# Patient Record
Sex: Male | Born: 1985 | Race: Black or African American | Hispanic: No | Marital: Single | Smoking: Never smoker
Health system: Southern US, Community
[De-identification: ages and names within clinical notes are randomized; demographics above are authoritative.]

## PROBLEM LIST (undated history)

## (undated) DIAGNOSIS — F419 Anxiety disorder, unspecified: Secondary | ICD-10-CM

## (undated) DIAGNOSIS — M2550 Pain in unspecified joint: Secondary | ICD-10-CM

## (undated) DIAGNOSIS — F32A Depression, unspecified: Secondary | ICD-10-CM

## (undated) HISTORY — DX: Anxiety disorder, unspecified: F41.9

## (undated) HISTORY — DX: Depression, unspecified: F32.A

## (undated) HISTORY — DX: Pain in unspecified joint: M25.50

## (undated) HISTORY — PX: EYE SURGERY: SHX253

---
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
## 2007-07-08 ENCOUNTER — Emergency Department (HOSPITAL_COMMUNITY): Admission: EM | Admit: 2007-07-08 | Discharge: 2007-07-08 | Payer: Self-pay | Admitting: Emergency Medicine

## 2007-07-09 ENCOUNTER — Inpatient Hospital Stay: Payer: Self-pay | Admitting: Internal Medicine

## 2008-07-30 IMAGING — CT CT ABD-PELV W/O CM
1 of 2 series · 14 of 32 positions shown, 18 images · non-contrast
Comparison: none

REASON FOR EXAM: Sudden onset of severe diffuse abdominal pain with nausea
COMMENTS:   LMP: (Male)

[Series 2: abd/pelvis · axial · 0.83mm/px · z∈[-1056,-550]mm · 14 of 185 slices shown, 18 images]
[im 8/185  soft-tissue]
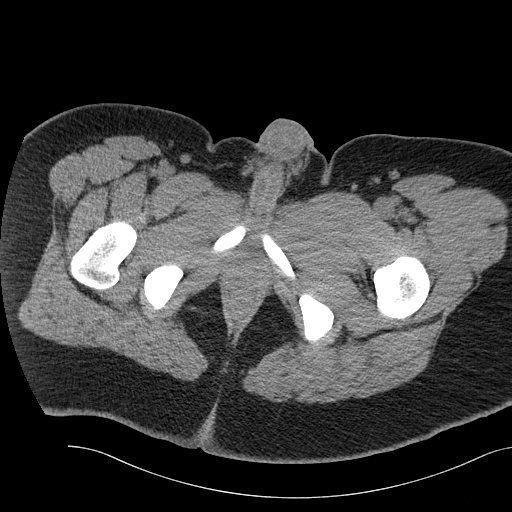
[im 8/185  bone]
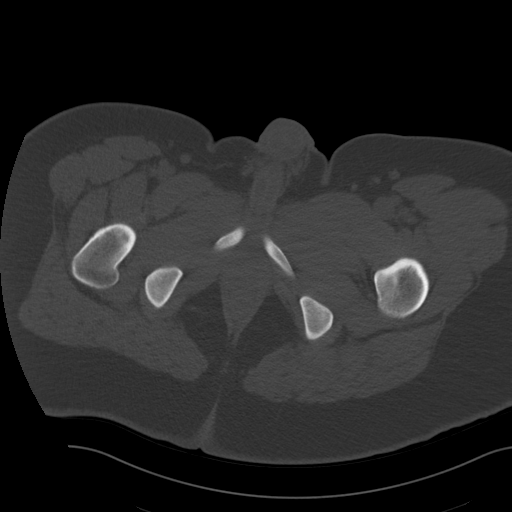
[im 24/185  soft-tissue]
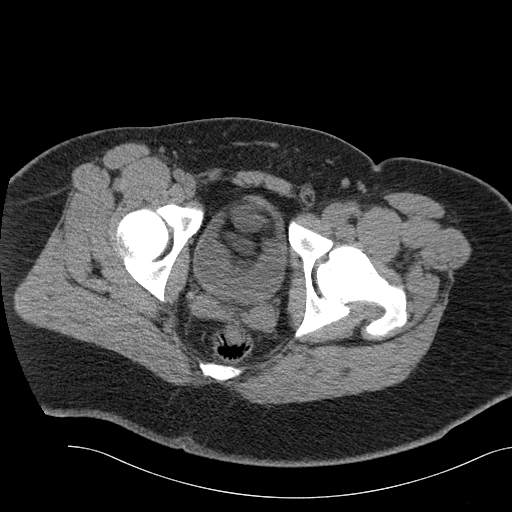
[im 39/185  soft-tissue]
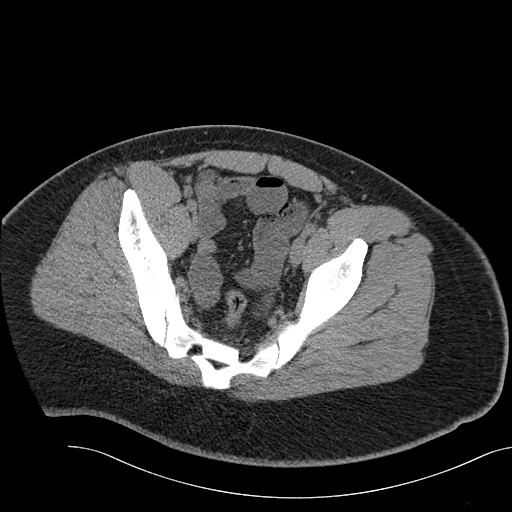
[im 54/185  soft-tissue]
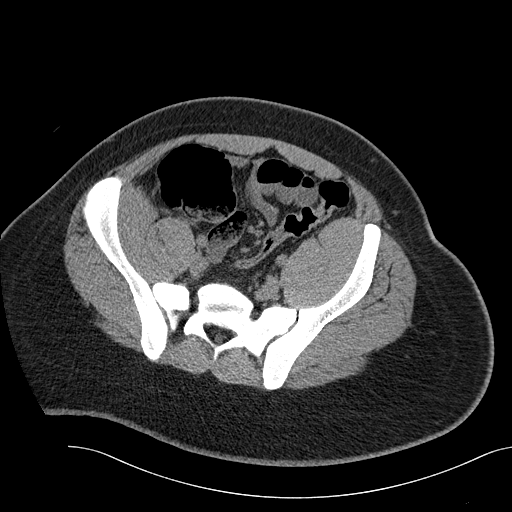
[im 70/185  soft-tissue]
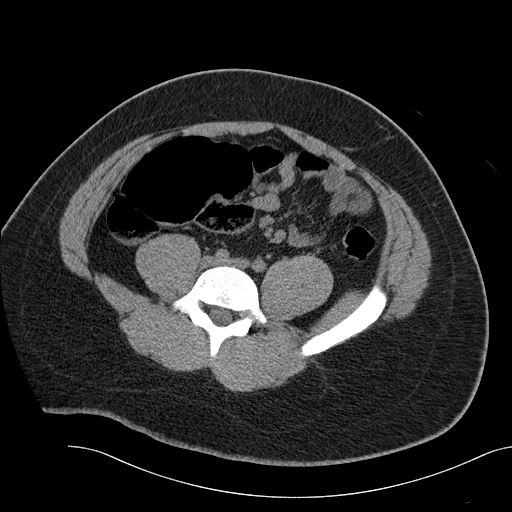
[im 85/185  soft-tissue]
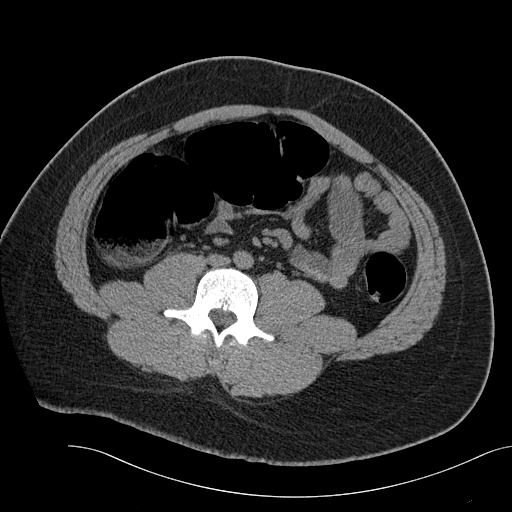
[im 100/185  soft-tissue]
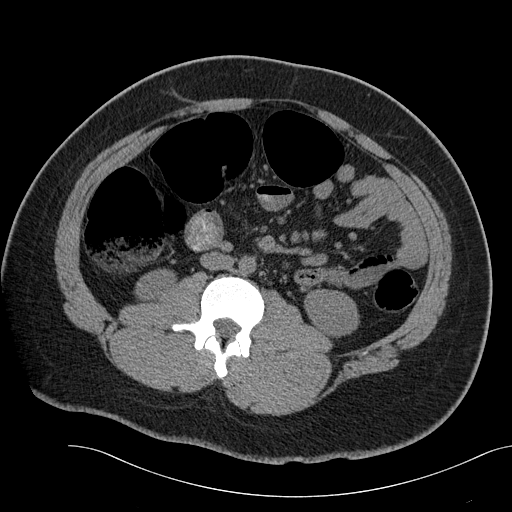
[im 116/185  soft-tissue]
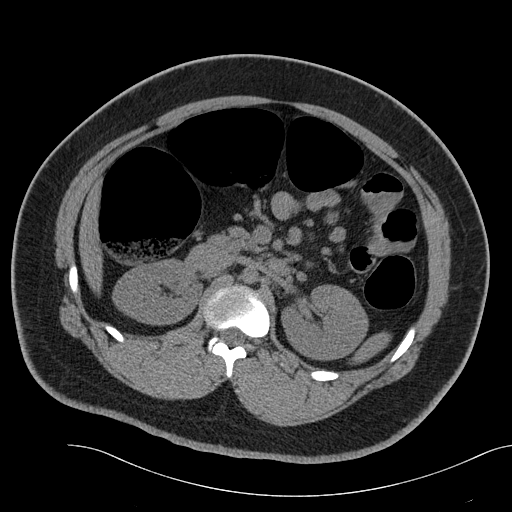
[im 131/185  soft-tissue]
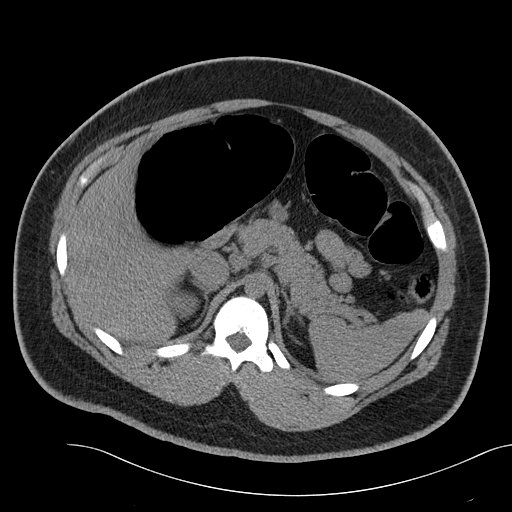
[im 131/185  bone]
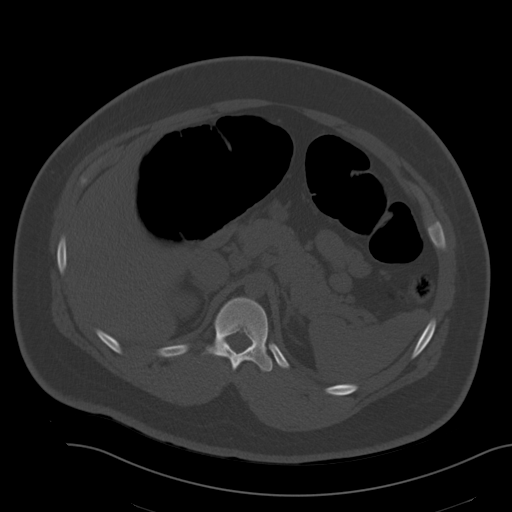
[im 146/185  soft-tissue]
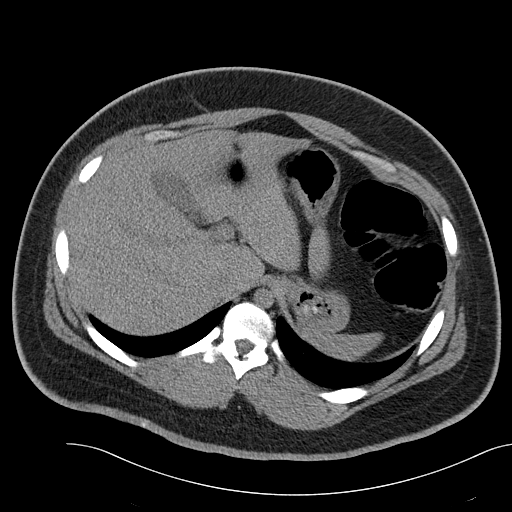
[im 154/185  lung]
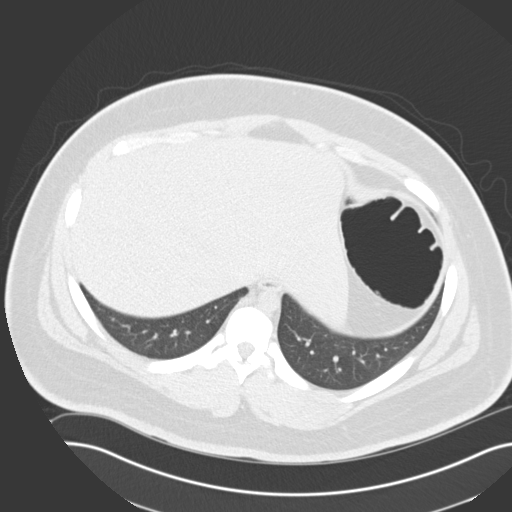
[im 162/185  soft-tissue]
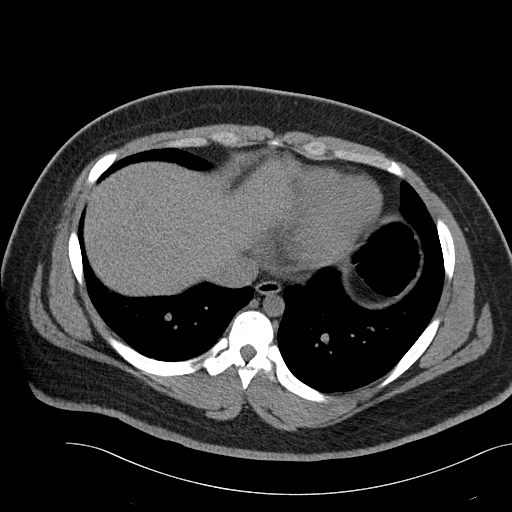
[im 162/185  lung]
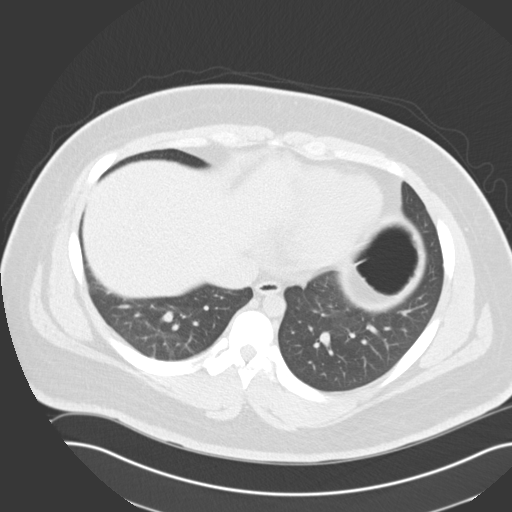
[im 169/185  lung]
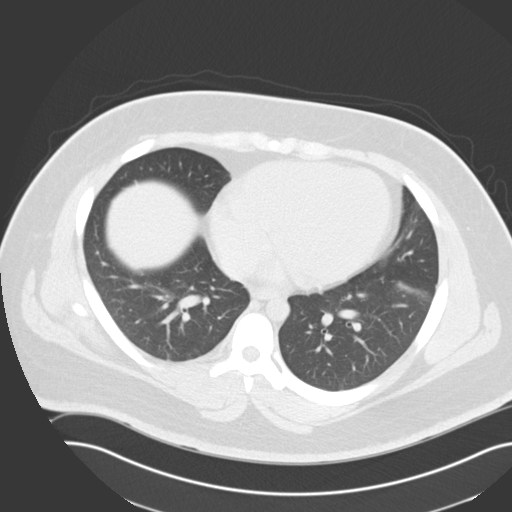
[im 177/185  soft-tissue]
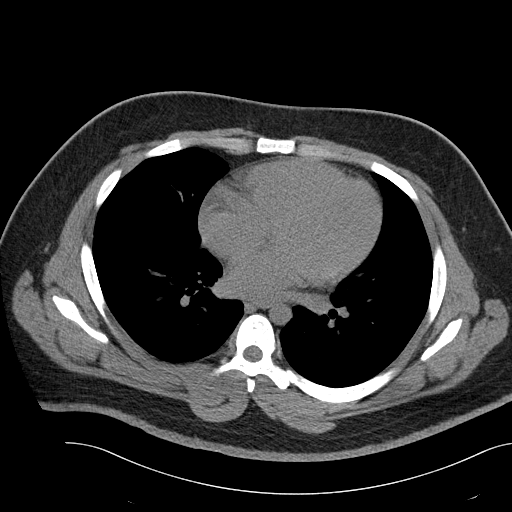
[im 177/185  lung]
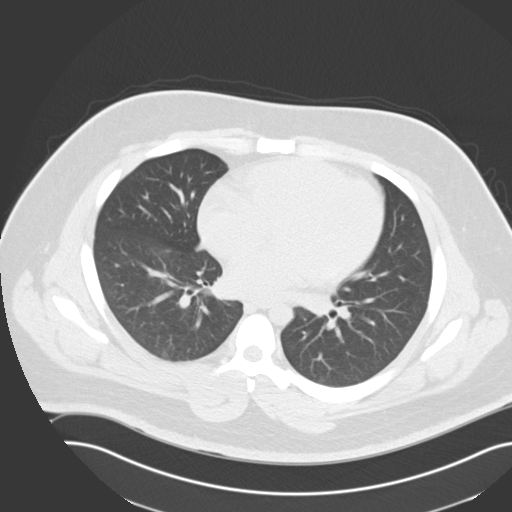

[14 of 32 positions shown; findings below may reference images not displayed]

PROCEDURE:     CT  - CT ABDOMEN AND PELVIS W[DATE]  [DATE]
RESULT:     Helical, noncontrast, 3.0 mm sections were obtained from the
lung bases through the pubic symphysis.

Evaluation of the lung bases demonstrates no gross abnormalities. Areas of
mild hypoventilation are appreciated.

Within the limitations of a noncontrast CT, the liver, spleen, adrenals,
pancreas and kidneys are unremarkable. Evaluation of the bowel demonstrates
multiple air/filled, mildly dilated/distended loops of large bowel. The
large bowel decompresses in the region of the descending colon with near
complete decompression of the sigmoid colon. There does not appear to be CT
evidence of obstructing masses. The patient does not have a history of
surgery. The cecum measures approximately 7.3 cm in diameter and the hepatic
flexure measures 9.3 cm. There does not appear to be evidence of significant
bowel wall thickening. Loops of small bowel are identified which appear to
be fluid filled. There is no evidence of abdominal or pelvic free fluid,
drainable loculated fluid collections or masses. Multiple lymph nodes are
appreciated scattered throughout the mesentery which appears to measure
cm and lower in size. There does not appear to be significant
retroperitoneal adenopathy. No free fluid or drainable loculated fluid
collections are identified. Evaluation of the pelvis demonstrates fluid
filled distended loops of small bowel without evidence of free fluid or
drainable loculated fluid collections. There does not appear to be evidence
of masses.
IMPRESSION: 1.     CT findings which appear to be consistent with obstruction of the
large bowel. There does not appear to be evidence of a mechanical
obstruction and this may represent decreased motility. No significant
evidence of bowel wall thickening is identified though lymph nodes are
appreciated throughout the mesentery. These findings may be secondary to
mesenteric adenitis with subsequent decreased motility within the large
bowel. Surveillance evaluation is recommended, if and as clinically
warranted.
2.     Dr. Lazarre of the Emergency Department was informed of these findings
via faxed report on 07/09/2007 at [DATE], Central Time. Dr. Jim was
informed via phone call at the time of this dictation.

## 2008-07-31 IMAGING — CR DG ABDOMEN 1V
1 series · 2 of 2 positions shown · non-contrast
Comparison: none

REASON FOR EXAM: Abdominal pain
COMMENTS:   LMP: (Male)

PROCEDURE:     DXR - DXR ABDOMEN AP ONLY  - July 10, 2007  [DATE]
RESULT:     Air is seen within nondilated loops of large and small bowel. A
moderate amount of stool is appreciated within the colon. The visualized
bony skeleton demonstrates no evidence of fracture or dislocation.

[Series 1: view not recorded · 0.17mm/px · 2 of 2 slices shown]
[im 1/2]
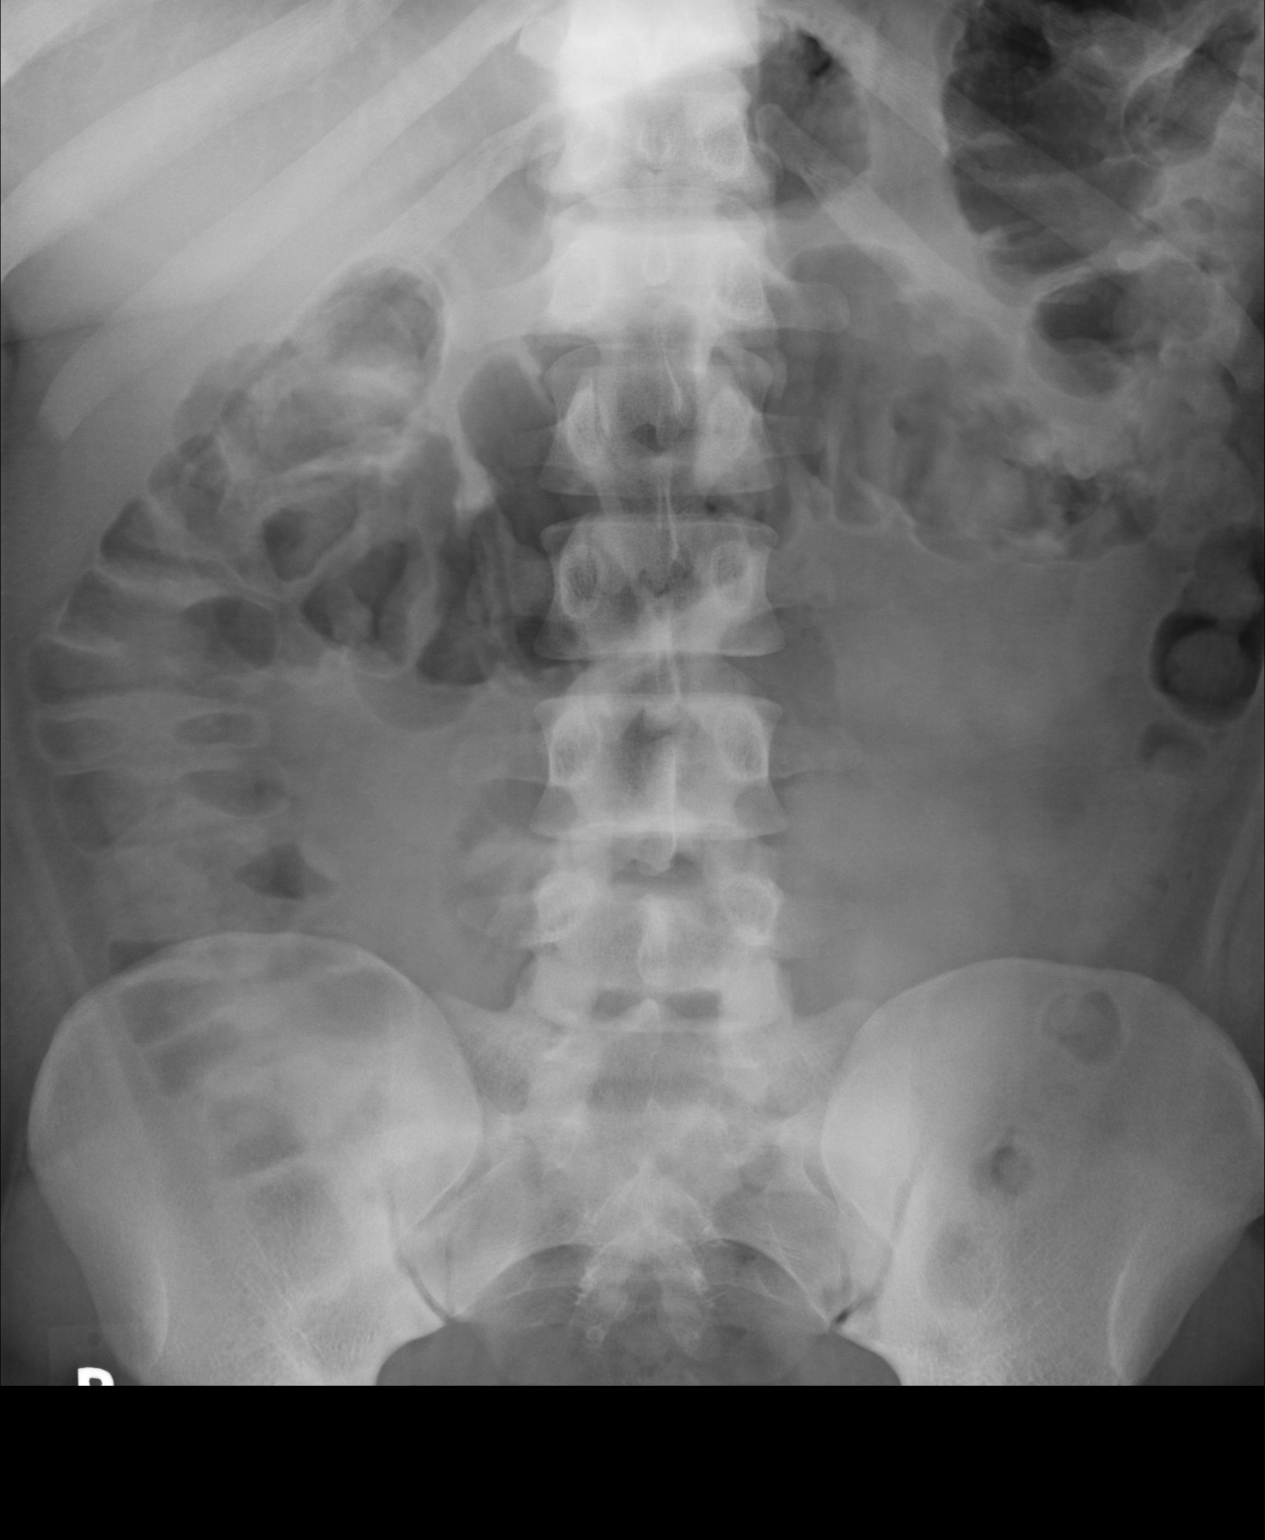
[im 2/2]
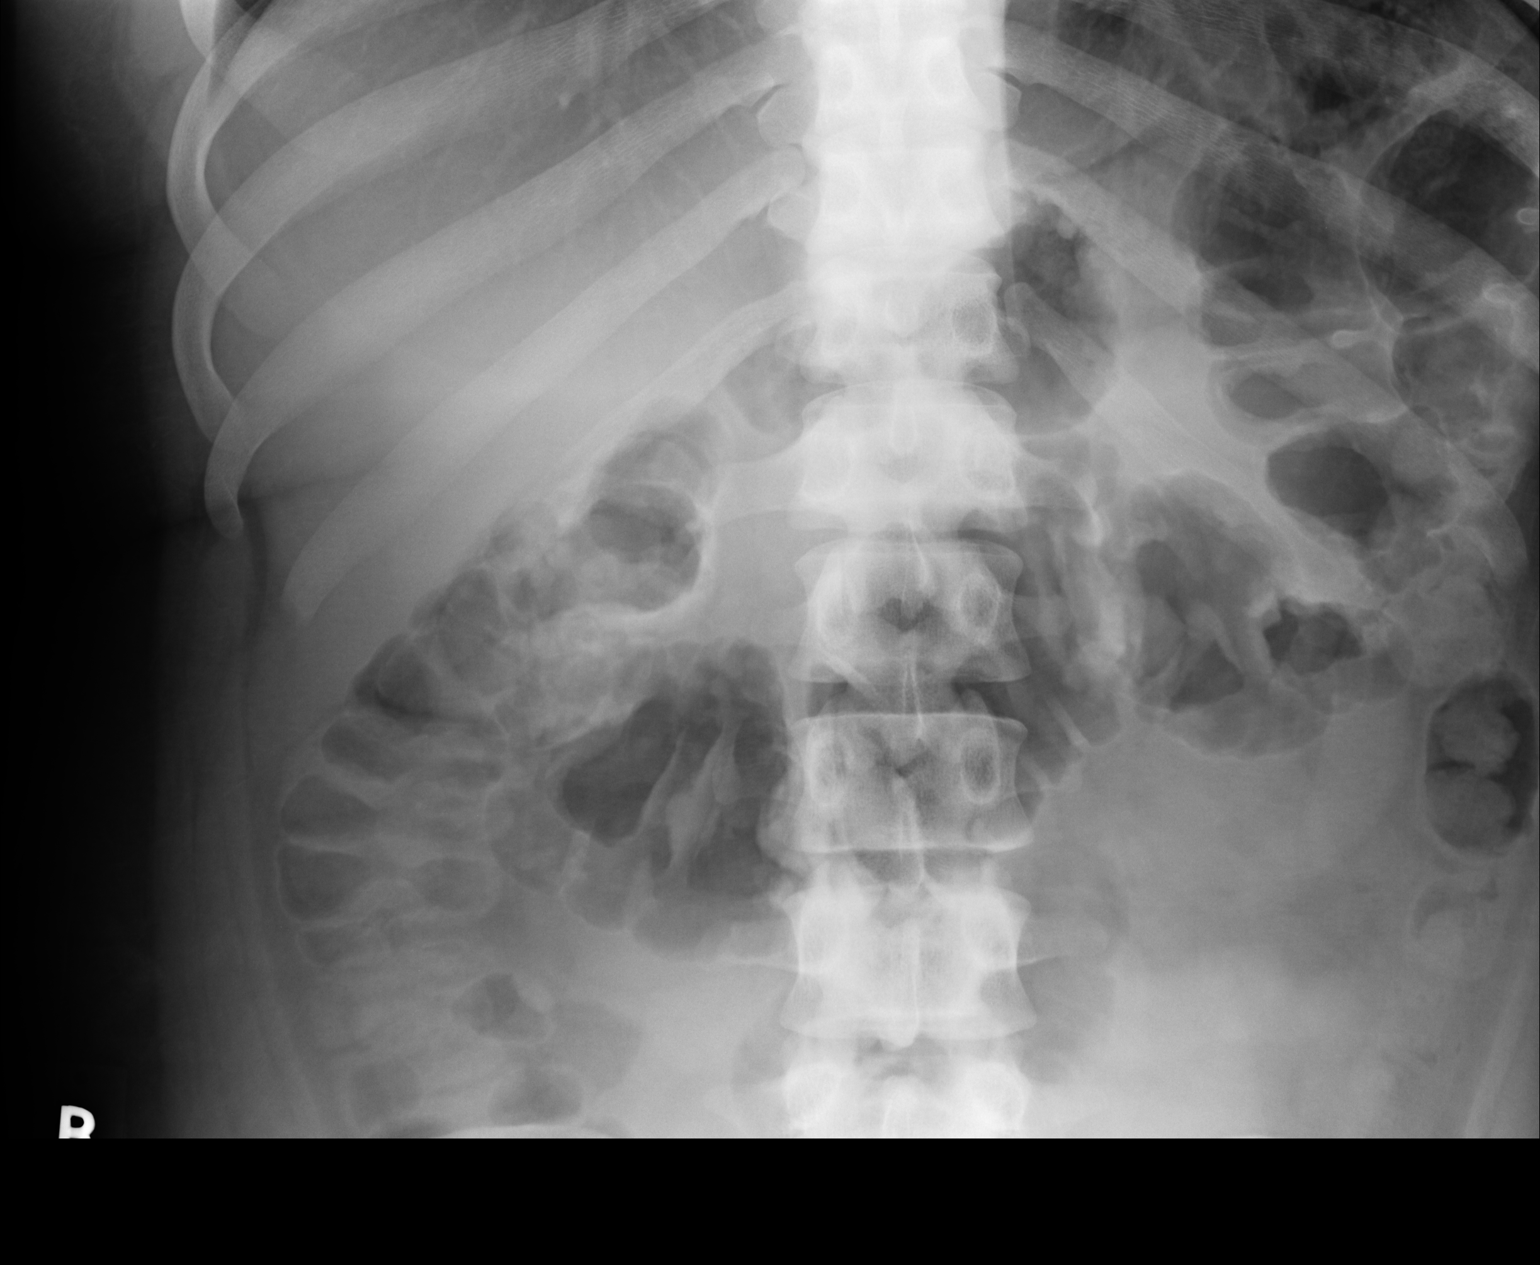

[2 of 2 positions shown; findings below may reference images not displayed]

IMPRESSION: Nonobstructive bowel gas pattern with a moderate amount of
stool.

## 2011-03-13 LAB — URINALYSIS, ROUTINE W REFLEX MICROSCOPIC
Nitrite: NEGATIVE
Specific Gravity, Urine: 1.03
pH: 6

## 2011-03-13 LAB — CBC
HCT: 41.8
Hemoglobin: 13.6
Platelets: 304
RBC: 5.48
WBC: 14.4 — ABNORMAL HIGH

## 2011-03-13 LAB — URINE CULTURE: Colony Count: NO GROWTH

## 2011-03-13 LAB — MONONUCLEOSIS SCREEN: Mono Screen: NEGATIVE

## 2011-03-13 LAB — DIFFERENTIAL
Eosinophils Relative: 0
Lymphocytes Relative: 5 — ABNORMAL LOW
Lymphs Abs: 0.7
Monocytes Relative: 6

## 2012-01-01 ENCOUNTER — Encounter (HOSPITAL_COMMUNITY): Payer: Self-pay | Admitting: Emergency Medicine

## 2012-01-01 ENCOUNTER — Emergency Department (HOSPITAL_COMMUNITY)
Admission: EM | Admit: 2012-01-01 | Discharge: 2012-01-01 | Disposition: A | Discharge status: Home / Self Care | Payer: Managed Care, Other (non HMO) | Attending: Emergency Medicine | Admitting: Emergency Medicine

## 2012-01-01 DIAGNOSIS — R45851 Suicidal ideations: Secondary | ICD-10-CM | POA: Insufficient documentation

## 2012-01-01 DIAGNOSIS — F411 Generalized anxiety disorder: Secondary | ICD-10-CM | POA: Insufficient documentation

## 2012-01-01 DIAGNOSIS — F3289 Other specified depressive episodes: Secondary | ICD-10-CM | POA: Insufficient documentation

## 2012-01-01 DIAGNOSIS — F329 Major depressive disorder, single episode, unspecified: Secondary | ICD-10-CM

## 2012-01-01 LAB — RAPID URINE DRUG SCREEN, HOSP PERFORMED
Amphetamines: NOT DETECTED
Barbiturates: NOT DETECTED
Benzodiazepines: NOT DETECTED
Cocaine: NOT DETECTED
Opiates: NOT DETECTED
Tetrahydrocannabinol: NOT DETECTED

## 2012-01-01 LAB — CBC WITH DIFFERENTIAL/PLATELET
Basophils Absolute: 0 10*3/uL (ref 0.0–0.1)
Basophils Relative: 0 % (ref 0–1)
Eosinophils Absolute: 0.1 10*3/uL (ref 0.0–0.7)
Eosinophils Relative: 1 % (ref 0–5)
HCT: 44.1 % (ref 39.0–52.0)
Hemoglobin: 14.4 g/dL (ref 13.0–17.0)
Lymphocytes Relative: 21 % (ref 12–46)
Lymphs Abs: 2.1 10*3/uL (ref 0.7–4.0)
MCH: 25.3 pg — ABNORMAL LOW (ref 26.0–34.0)
MCHC: 32.7 g/dL (ref 30.0–36.0)
MCV: 77.4 fL — ABNORMAL LOW (ref 78.0–100.0)
Monocytes Absolute: 0.6 10*3/uL (ref 0.1–1.0)
Monocytes Relative: 6 % (ref 3–12)
Neutro Abs: 7.4 10*3/uL (ref 1.7–7.7)
Neutrophils Relative %: 72 % (ref 43–77)
Platelets: 362 10*3/uL (ref 150–400)
RBC: 5.7 MIL/uL (ref 4.22–5.81)
RDW: 13.9 % (ref 11.5–15.5)
WBC: 10.2 10*3/uL (ref 4.0–10.5)

## 2012-01-01 LAB — COMPREHENSIVE METABOLIC PANEL
ALT: 21 U/L (ref 0–53)
AST: 20 U/L (ref 0–37)
Albumin: 4.1 g/dL (ref 3.5–5.2)
Alkaline Phosphatase: 89 U/L (ref 39–117)
BUN: 17 mg/dL (ref 6–23)
CO2: 24 mEq/L (ref 19–32)
Calcium: 9.7 mg/dL (ref 8.4–10.5)
Chloride: 101 mEq/L (ref 96–112)
Creatinine, Ser: 1.07 mg/dL (ref 0.50–1.35)
GFR calc Af Amer: >90 mL/min (ref >90)
GFR calc non Af Amer: >90 mL/min (ref >90)
Glucose, Bld: 86 mg/dL (ref 70–99)
Potassium: 4 mEq/L (ref 3.5–5.1)
Sodium: 136 mEq/L (ref 135–145)
Total Bilirubin: 0.3 mg/dL (ref 0.3–1.2)
Total Protein: 8.5 g/dL — ABNORMAL HIGH (ref 6.0–8.3)

## 2012-01-01 LAB — ACETAMINOPHEN LEVEL: Acetaminophen (Tylenol), Serum: <15 ug/mL (ref 10–30)

## 2012-01-01 LAB — ETHANOL: Alcohol, Ethyl (B): <11 mg/dL (ref 0–11)

## 2012-01-01 MED ORDER — LORAZEPAM 1 MG PO TABS: 1.0000 mg | ORAL_TABLET | Freq: Three times a day (TID) | ORAL | Status: DC | PRN

## 2012-01-01 MED ORDER — IBUPROFEN 600 MG PO TABS: 600.0000 mg | ORAL_TABLET | Freq: Three times a day (TID) | ORAL | Status: DC | PRN

## 2012-01-01 MED ORDER — BUPROPION HCL ER (SR) 100 MG PO TB12: 100.0000 mg | ORAL_TABLET | Freq: Every day | ORAL | Status: DC

## 2012-01-01 MED ORDER — ONDANSETRON HCL 4 MG PO TABS: 4.0000 mg | ORAL_TABLET | Freq: Three times a day (TID) | ORAL | Status: DC | PRN

## 2012-01-01 MED ORDER — ZOLPIDEM TARTRATE 5 MG PO TABS: 5.0000 mg | ORAL_TABLET | Freq: Every evening | ORAL | Status: DC | PRN

## 2012-01-01 NOTE — ED Notes (Signed)
Pt. Belongings are labeled. Belongings( black\silver shoes, white socks, black shirt, silver shorts, black apple cell phone, black wallet along with keys)

## 2012-01-01 NOTE — ED Provider Notes (Signed)
History     CSN: [id number]  Arrival date & time 01/01/12  1716   First MD Initiated Contact with Patient 01/01/12 1814      Chief Complaint  Patient presents with  . Suicidal    (Consider location/radiation/quality/duration/timing/severity/associated sxs/prior treatment) HPI Comments: Pt is a 26yo male who presents to ED today with his mother. Per pt, has been dealing with depression for some time. He states his girlfriend broke up with him today and he no longer wants to deal with pain. Per mother, pt called her today crying and saying he did not want to live and "was not worthy" of anyone. Per mother, pt with long standing depression, has been on medications in the past, but took himself off of them. Pt stated he could not sleep while taking medications. Pt admits to frequent crying spells, panic attacks, depression. No HI.  The history is provided by the patient and a parent.    No past medical history on file.  Past Surgical History  Procedure Date  . Eye surgery     No family history on file.  History  Substance Use Topics  . Smoking status: Never Smoker   . Smokeless tobacco: Not on file  . Alcohol Use: No      Review of Systems  Constitutional: Negative for fever and chills.  Respiratory: Negative.   Cardiovascular: Negative.   Gastrointestinal: Negative for vomiting, abdominal pain and diarrhea.  Genitourinary: Negative for dysuria.  Musculoskeletal: Negative.   Neurological: Negative for dizziness, weakness and numbness.  Psychiatric/Behavioral: Positive for suicidal ideas. The patient is nervous/anxious.     Allergies  Review of patient's allergies indicates no known allergies.  Home Medications  No current outpatient prescriptions on file.  BP 138/79  Pulse 80  Temp 98.2 F (36.8 C) (Oral)  Resp 18  SpO2 97%  Physical Exam  Nursing note and vitals reviewed. Constitutional: He is oriented to person, place, and time. He appears well-developed  and well-nourished. No distress.  HENT:  Head: Normocephalic.  Eyes: Conjunctivae are normal.  Neck: Neck supple.  Cardiovascular: Normal rate, regular rhythm and normal heart sounds.   Pulmonary/Chest: Effort normal and breath sounds normal. No respiratory distress. He has no wheezes.  Abdominal: Soft. Bowel sounds are normal. He exhibits no distension. There is no tenderness. There is no rebound.  Neurological: He is alert and oriented to person, place, and time.  Skin: Skin is warm and dry.  Psychiatric:       Pt is tearful, appears upset, avoiding eye contact    ED Course  Procedures (including critical care time)  Pt with SI, depression. Mom requesting inpatient treatment.   Results for orders placed during the hospital encounter of 01/01/12  URINE RAPID DRUG SCREEN (HOSP PERFORMED)      Component Value Range   Opiates NONE DETECTED  NONE DETECTED   Cocaine NONE DETECTED  NONE DETECTED   Benzodiazepines NONE DETECTED  NONE DETECTED   Amphetamines NONE DETECTED  NONE DETECTED   Tetrahydrocannabinol NONE DETECTED  NONE DETECTED   Barbiturates NONE DETECTED  NONE DETECTED  CBC WITH DIFFERENTIAL      Component Value Range   WBC 10.2  4.0 - 10.5 K/uL   RBC 5.70  4.22 - 5.81 MIL/uL   Hemoglobin 14.4  13.0 - 17.0 g/dL   HCT 13.0  86.5 - 78.4 %   MCV 77.4 (*) 78.0 - 100.0 fL   MCH 25.3 (*) 26.0 - 34.0 pg  MCHC 32.7  30.0 - 36.0 g/dL   RDW 96.0  45.4 - 09.8 %   Platelets 362  150 - 400 K/uL   Neutrophils Relative 72  43 - 77 %   Neutro Abs 7.4  1.7 - 7.7 K/uL   Lymphocytes Relative 21  12 - 46 %   Lymphs Abs 2.1  0.7 - 4.0 K/uL   Monocytes Relative 6  3 - 12 %   Monocytes Absolute 0.6  0.1 - 1.0 K/uL   Eosinophils Relative 1  0 - 5 %   Eosinophils Absolute 0.1  0.0 - 0.7 K/uL   Basophils Relative 0  0 - 1 %   Basophils Absolute 0.0  0.0 - 0.1 K/uL  COMPREHENSIVE METABOLIC PANEL      Component Value Range   Sodium 136  135 - 145 mEq/L   Potassium 4.0  3.5 - 5.1 mEq/L     Chloride 101  96 - 112 mEq/L   CO2 24  19 - 32 mEq/L   Glucose, Bld 86  70 - 99 mg/dL   BUN 17  6 - 23 mg/dL   Creatinine, Ser 1.19  0.50 - 1.35 mg/dL   Calcium 9.7  8.4 - 14.7 mg/dL   Total Protein 8.5 (*) 6.0 - 8.3 g/dL   Albumin 4.1  3.5 - 5.2 g/dL   AST 20  0 - 37 U/L   ALT 21  0 - 53 U/L   Alkaline Phosphatase 89  39 - 117 U/L   Total Bilirubin 0.3  0.3 - 1.2 mg/dL   GFR calc non Af Amer >90  >90 mL/min   GFR calc Af Amer >90  >90 mL/min  ETHANOL      Component Value Range   Alcohol, Ethyl (B) <11  0 - 11 mg/dL  ACETAMINOPHEN LEVEL      Component Value Range   Acetaminophen (Tylenol), Serum <15.0  10 - 30 ug/mL   7:33 PM Pt medically cleared, spoke with ACT, will come assess      MDM          Lottie Mussel, PA 01/04/12 1600

## 2012-01-01 NOTE — BH Assessment (Signed)
Assessment Note   Jamie Murphy is a 26 y.o. male who presents to [facility] voluntarily, endorsing passive SI with no plan, after breaking up with his girlfriend of 5 years. Pt's mother is at bed side. Pt called his mother stating "I don't want to live anymore" earlier today. Pt has a history of major depressive disorder. He is currently not on medications and has no outpatient providers. Pt reports he feels like he can "not meet anyone's expectations" and feels like he has no one to confide in at this time, due to his recent break up. Pt appears tearful and is anxious about receiving treatment. Pt reports he lives with a room mate and is currently a senior at [employer]. Pt denies HI, AHVH, and SA. Pt denies suicidal intent, stating "ya I had (SI) thoughts but I know things can be solved." Pt states he feels he contract for safety at this time.   Pt's mother Ms Gearldine Shown) presented with pt. Mother reports pt expressed SI on phone. She states she is concerned that pt will not follow up with outpatient providers. Mother was orgnially concerned about pt returning home. Pt, mother, and this Clinical research associate discussed treatment options. Pt states he does not want inpatient at this time. Pt and mother were able to agree on a safety plan including informing pt's roommate of mother's concerns. Pt is agreeable to plan.   A telepsych has been ordered to assist with disposition and possible medication recommendations.     Axis I: Major Depression, Recurrent severe Axis II: Deferred Axis III: No past medical history on file. Axis IV: other psychosocial or environmental problems, problems related to social environment and problems with primary support group Axis V: 41-50 serious symptoms  Past Medical History: No past medical history on file.  Past Surgical History  Procedure Date  . Eye surgery     Family History: No family history on file.  Social History:  reports that he has never smoked. He does not have  any smokeless tobacco history on file. He reports that he does not drink alcohol or use illicit drugs.  Additional Social History:  Alcohol / Drug Use History of alcohol / drug use?: No history of alcohol / drug abuse  CIWA: CIWA-Ar BP: 138/79 mmHg Pulse Rate: 80  COWS:    Allergies: No Known Allergies  Home Medications:  (Not in a hospital admission)  OB/GYN Status:  No LMP for male patient.  General Assessment Data Location of Assessment: [facility] ED Living Arrangements: Non-relatives/Friends Can pt return to current living arrangement?: Yes Admission Status: Voluntary Is patient capable of signing voluntary admission?: Yes Transfer from: Acute Hospital Referral Source: Self/Family/Friend  Education Status Is patient currently in school?: Yes Highest grade of school patient has completed: some college Name of school: [employer]  Risk to self Suicidal Ideation: Yes-Currently Present Suicidal Intent: No Is patient at risk for suicide?: Yes Suicidal Plan?: No Access to Means: No What has been your use of drugs/alcohol within the last 12 months?: denies Previous Attempts/Gestures: No How many times?: 0  Other Self Harm Risks: none Triggers for Past Attempts: None known Intentional Self Injurious Behavior: None Family Suicide History: No Recent stressful life event(s): Conflict (Comment) (broke up with girlfriend today) Persecutory voices/beliefs?: No Depression: Yes Depression Symptoms: Tearfulness;Isolating;Feeling worthless/self pity Substance abuse history and/or treatment for substance abuse?: No Suicide prevention information given to non-admitted patients: Not applicable  Risk to Others Homicidal Ideation: No Thoughts of Harm to Others: No Current Homicidal Intent: No  Current Homicidal Plan: No Access to Homicidal Means: No Identified Victim: none History of harm to others?: No Assessment of Violence: None Noted Violent Behavior Description: cooperative Does  patient have access to weapons?: No Criminal Charges Pending?: No Does patient have a court date: No  Psychosis Hallucinations: None noted Delusions: None noted  Mental Status Report Appear/Hygiene:  (casual) Eye Contact: Fair Motor Activity: Unremarkable Speech: Logical/coherent Level of Consciousness: Alert Mood: Depressed Affect: Appropriate to circumstance;Depressed Anxiety Level: Moderate Thought Processes: Coherent;Relevant Judgement: Impaired Orientation: Person;Time;Place;Situation Obsessive Compulsive Thoughts/Behaviors: None  Cognitive Functioning Concentration: Normal Memory: Recent Intact;Remote Intact IQ: Average Insight: Fair Impulse Control: Good Appetite: Fair Weight Loss: 0  Weight Gain: 0  Sleep: No Change Total Hours of Sleep: 8  Vegetative Symptoms: None  ADLScreening [facility]) Patient's cognitive ability adequate to safely complete daily activities?: Yes Patient able to express need for assistance with ADLs?: Yes Independently performs ADLs?: Yes  Abuse/Neglect [facility]) Physical Abuse: Denies Verbal Abuse: Denies Sexual Abuse: Denies  Prior Inpatient Therapy Prior Inpatient Therapy: No Prior Therapy Dates: na Prior Therapy Facilty/Provider(s): na Reason for Treatment: na  Prior Outpatient Therapy Prior Outpatient Therapy: Yes Prior Therapy Dates: 2012 Prior Therapy Facilty/Provider(s): [facility] Reason for Treatment: depression  ADL Screening (condition at time of admission) Patient's cognitive ability adequate to safely complete daily activities?: Yes Patient able to express need for assistance with ADLs?: Yes Independently performs ADLs?: Yes Weakness of Legs: None Weakness of Arms/Hands: None  Home Assistive Devices/Equipment Home Assistive Devices/Equipment: None    Abuse/Neglect Assessment (Assessment to be complete while patient is alone) Physical Abuse: Denies Verbal Abuse: Denies Sexual Abuse:  Denies Exploitation of patient/patient's resources: Denies Self-Neglect: Denies Values / Beliefs Cultural Requests During Hospitalization: None Spiritual Requests During Hospitalization: None   Advance Directives (For Healthcare) Advance Directive: Patient does not have advance directive;Patient would not like information Pre-existing out of facility DNR order (yellow form or pink MOST form): No Nutrition Screen Diet: Regular Unintentional weight loss greater than 10lbs within the last month: No Problems chewing or swallowing foods and/or liquids: No Home Tube Feeding or Total Parenteral Nutrition (TPN): No Patient appears severely malnourished: No  Additional Information 1:1 In Past 12 Months?: No CIRT Risk: No Elopement Risk: No Does patient have medical clearance?: Yes     Disposition:  Disposition Disposition of Patient: Other dispositions (pending telepsych)  On Site Evaluation by:   Reviewed with Physician:     Georgina Quint A 01/01/2012 9:01 PM

## 2012-01-01 NOTE — ED Notes (Signed)
Patient discharge to home with mom with a safety plan. Patient ambulatory with a steady gait. Respirations equal and unlabored. Skin warm and dry. No acute distress noted.

## 2012-01-01 NOTE — ED Notes (Signed)
Telepsych completed.  

## 2012-01-01 NOTE — ED Notes (Signed)
Crying, feels like can't "live up to anyone's standards"  Has been on Zoloft/abilify- hx of depression/dysthymia. Had an episode like this in his senior year. Mom states he called, was crying hysterically, stated did not want to live anymore. Crying at triage.

## 2012-01-07 NOTE — ED Provider Notes (Signed)
Medical screening examination/treatment/procedure(s) were performed by non-physician practitioner and as supervising physician I was immediately available for consultation/collaboration.  Raeford Razor, MD 01/07/12 [phone]

## 2012-09-13 ENCOUNTER — Ambulatory Visit (INDEPENDENT_AMBULATORY_CARE_PROVIDER_SITE_OTHER): Payer: No Typology Code available for payment source | Admitting: Family Medicine

## 2012-09-13 VITALS — BP 129/77 | HR 82 | Temp 98.4°F | Resp 18 | Ht 76.0 in | Wt 268.4 lb

## 2012-09-13 DIAGNOSIS — Z0000 Encounter for general adult medical examination without abnormal findings: Secondary | ICD-10-CM

## 2012-09-13 NOTE — Progress Notes (Signed)
Tuberculosis Risk Questionnaire  1. Were you born outside the [place] in one of the following parts of the world:    [place], [place], [place], [place] or [place]?  No no  2. Have you traveled outside the [place] and lived for more than one month in one of the following parts of the world:  [place], [place], [place], [place] or [place]?  No no  3. Do you have a compromised immune system such as from any of the following conditions:  HIV/AIDS, organ or bone marrow transplantation, diabetes, immunosuppressive   medicines (e.g. Prednisone, Remicaide), leukemia, lymphoma, cancer of the   head or neck, gastrectomy or jejunal bypass, end-stage renal disease (on   dialysis), or silicosis?no   No    4. Have you ever done one of the following:    Used crack cocaine, injected illegal drugs, worked or resided in jail or prison,   worked or resided at a homeless shelter, or worked as a Research scientist (physical sciences) in   direct contact with patients?  Nono  5. Have you ever been exposed to anyone with infectious tuberculosis?  Nono   Tuberculosis Symptom Questionnaire  Do you currently have any of the following symptoms?  1. Unexplained cough lasting more than 3 weeks? Nono  Unexplained fever lasting more than 3 weeks. No no  3. Night Sweats (sweating that leaves the bedclothes and sheets wet)   Nono  4. Shortness of Breath Nono  5. Chest Pain Nono  6. Unintentional weight loss  Nono  7. Unexplained fatigue (very tired for no reason) Nono   Patient ID: Jamie Murphy MRN: [id number], DOB: 13-Oct-1985 27 y.o. Date of Encounter: 09/13/2012, 7:17 PM  Primary Physician: No primary provider on file.  Chief Complaint: Physical (CPE)  HPI: 27 y.o. y/o male with history noted below here for CPE.  Doing well. No issues/complaints.  Review of Systems: Consitutional: No fever, chills, fatigue, night sweats, lymphadenopathy, or weight changes. Eyes: No visual changes,  eye redness, or discharge. ENT/Mouth: Ears: No otalgia, tinnitus, hearing loss, discharge. Nose: No congestion, rhinorrhea, sinus pain, or epistaxis. Throat: No sore throat, post nasal drip, or teeth pain. Cardiovascular: No CP, palpitations, diaphoresis, DOE, edema, orthopnea, PND. Respiratory: No cough, hemoptysis, SOB, or wheezing. Gastrointestinal: No anorexia, dysphagia, reflux, pain, nausea, vomiting, hematemesis, diarrhea, constipation, BRBPR, or melena. Genitourinary: No dysuria, frequency, urgency, hematuria, incontinence, nocturia, decreased urinary stream, discharge, impotence, or testicular pain/masses. Musculoskeletal: No decreased ROM, myalgias, stiffness, joint swelling, or weakness. Skin: No rash, erythema, lesion changes, pain, warmth, jaundice, or pruritis. Neurological: No headache, dizziness, syncope, seizures, tremors, memory loss, coordination problems, or paresthesias. Psychological: No anxiety, depression, hallucinations, SI/HI. Endocrine: No fatigue, polydipsia, polyphagia, polyuria, or known diabetes. All other systems were reviewed and are otherwise negative.  No past medical history on file.   Past Surgical History  Procedure Laterality Date  . Eye surgery      Home Meds:  Prior to Admission medications   Medication Sig Start Date End Date Taking? Authorizing Provider  buPROPion (WELLBUTRIN SR) 100 MG 12 hr tablet Take 1 tablet (100 mg total) by mouth daily. 01/01/12 12/31/12 Yes Raeford Razor, MD    Allergies: No Known Allergies  History   Social History  . Marital Status: Single    Spouse Name: N/A    Number of Children: N/A  . Years of Education: N/A   Occupational History  . Not on file.   Social History Main Topics  . Smoking status:  Never Smoker   . Smokeless tobacco: Never Used  . Alcohol Use: No  . Drug Use: No  . Sexually Active: Not on file   Other Topics Concern  . Not on file   Social History Narrative  . No narrative on file     No family history on file.  Physical Exam: Blood pressure 129/77, pulse 82, temperature 98.4 F (36.9 C), temperature source Oral, resp. rate 18, height 6\' 4"  (1.93 m), weight 268 lb 6.4 oz (121.745 kg), SpO2 99.00%.  General: Well developed, well nourished, in no acute distress. HEENT: Normocephalic, atraumatic. Conjunctiva pink, sclera non-icteric. Pupils 2 mm constricting to 1 mm, round, regular, and equally reactive to light and accomodation. EOMI. Internal auditory canal clear. TMs with good cone of light and without pathology. Nasal mucosa pink. Nares are without discharge. No sinus tenderness. Oral mucosa pink. Dentition good. Pharynx without exudate.   Neck: Supple. Trachea midline. No thyromegaly. Full ROM. No lymphadenopathy. Lungs: Clear to auscultation bilaterally without wheezes, rales, or rhonchi. Breathing is of normal effort and unlabored. Cardiovascular: RRR with S1 S2. No murmurs, rubs, or gallops appreciated. Distal pulses 2+ symmetrically. No carotid or abdominal bruits Abdomen: Soft, non-tender, non-distended with normoactive bowel sounds. No hepatosplenomegaly or masses. No rebound/guarding. No CVA tenderness. Without hernias.   Genitourinary:  circumcised male. No penile lesions. Testes descended bilaterally, and smooth without tenderness or masses.  Musculoskeletal: Full range of motion and 5/5 strength throughout. Without swelling, atrophy, tenderness, crepitus, or warmth. Extremities without clubbing, cyanosis, or edema. Calves supple. Skin: Warm and moist without erythema, ecchymosis, wounds, or rash. Neuro: A+Ox3. CN II-XII grossly intact. Moves all extremities spontaneously. Full sensation throughout. Normal gait. DTR 2+ throughout upper and lower extremities. Finger to nose intact. Psych:  Responds to questions appropriately with a normal affect.   UA:   Assessment/Plan:  27 y.o. y/o  male here for CPE y/o  male here for CPE:  No abnormalities Patient doing well on  wellbutrin -  Signed, Elvina Sidle, MD 09/13/2012 7:17 PM

## 2020-06-07 ENCOUNTER — Emergency Department (HOSPITAL_BASED_OUTPATIENT_CLINIC_OR_DEPARTMENT_OTHER)
Admission: EM | Admit: 2020-06-07 | Discharge: 2020-06-07 | Disposition: A | Discharge status: Home / Self Care | Payer: BC Managed Care – PPO | Attending: Emergency Medicine | Admitting: Emergency Medicine

## 2020-06-07 ENCOUNTER — Other Ambulatory Visit: Payer: Self-pay

## 2020-06-07 ENCOUNTER — Encounter (HOSPITAL_BASED_OUTPATIENT_CLINIC_OR_DEPARTMENT_OTHER): Payer: Self-pay

## 2020-06-07 DIAGNOSIS — R000 Tachycardia, unspecified: Secondary | ICD-10-CM | POA: Insufficient documentation

## 2020-06-07 DIAGNOSIS — R197 Diarrhea, unspecified: Secondary | ICD-10-CM | POA: Diagnosis present

## 2020-06-07 LAB — CBC
HCT: 42.6 % (ref 39.0–52.0)
Hemoglobin: 13.5 g/dL (ref 13.0–17.0)
MCH: 25.4 pg — ABNORMAL LOW (ref 26.0–34.0)
MCHC: 31.7 g/dL (ref 30.0–36.0)
MCV: 80.2 fL (ref 80.0–100.0)
Platelets: 314 10*3/uL (ref 150–400)
RBC: 5.31 MIL/uL (ref 4.22–5.81)
RDW: 14.2 % (ref 11.5–15.5)
WBC: 9 10*3/uL (ref 4.0–10.5)
nRBC: 0 % (ref 0.0–0.2)

## 2020-06-07 LAB — COMPREHENSIVE METABOLIC PANEL
ALT: 47 U/L — ABNORMAL HIGH (ref 0–44)
AST: 36 U/L (ref 15–41)
Albumin: 3.9 g/dL (ref 3.5–5.0)
Alkaline Phosphatase: 76 U/L (ref 38–126)
Anion gap: 10 (ref 5–15)
BUN: 12 mg/dL (ref 6–20)
CO2: 23 mmol/L (ref 22–32)
Calcium: 8.9 mg/dL (ref 8.9–10.3)
Chloride: 102 mmol/L (ref 98–111)
Creatinine, Ser: 1.06 mg/dL (ref 0.61–1.24)
GFR, Estimated: >60 mL/min (ref >60)
Glucose, Bld: 107 mg/dL — ABNORMAL HIGH (ref 70–99)
Potassium: 3.8 mmol/L (ref 3.5–5.1)
Sodium: 135 mmol/L (ref 135–145)
Total Bilirubin: 0.4 mg/dL (ref 0.3–1.2)
Total Protein: 7.7 g/dL (ref 6.5–8.1)

## 2020-06-07 LAB — URINALYSIS, ROUTINE W REFLEX MICROSCOPIC
Bilirubin Urine: NEGATIVE
Glucose, UA: NEGATIVE mg/dL
Hgb urine dipstick: NEGATIVE
Ketones, ur: NEGATIVE mg/dL
Leukocytes,Ua: NEGATIVE
Nitrite: NEGATIVE
Protein, ur: NEGATIVE mg/dL
Specific Gravity, Urine: >1.03 — ABNORMAL HIGH (ref 1.005–1.030)
pH: 5.5 (ref 5.0–8.0)

## 2020-06-07 LAB — LIPASE, BLOOD: Lipase: 19 U/L (ref 11–51)

## 2020-06-07 MED ORDER — METRONIDAZOLE 500 MG PO TABS: 500.0000 mg | ORAL_TABLET | Freq: Two times a day (BID) | ORAL | 0 refills | Status: AC

## 2020-06-07 MED ORDER — CIPROFLOXACIN HCL 500 MG PO TABS: 500.0000 mg | ORAL_TABLET | Freq: Two times a day (BID) | ORAL | 0 refills | Status: AC

## 2020-06-07 NOTE — ED Notes (Signed)
Presents with 3 day hx of diarrhea, no changes in color or smell, just very watery, denies fever, nausea or vomiting, can tolerate PO fluids and solids. States prior to episode attended a cook out. States has not tried any over the counter anti-diarrhea meds.

## 2020-06-07 NOTE — ED Provider Notes (Signed)
[facility] EMERGENCY DEPARTMENT Provider Note   CSN: [id number] Arrival date & time: 06/07/20  1408     History Chief Complaint  Patient presents with  . Diarrhea    Jamie Murphy is a 34 y.o. male presented to emergency department diarrhea for 4 days.  The patient reports onset of diarrhea 4 days ago, after eating [employer] fast food the day before.  Reports 4-5 episodes of watery diarrhea, which loosely formed every day.  His last diarrhea episode was around 1 PM today.  He denies fevers or chills.  He denies abdominal pain.  He reports he saw a little bit of streaking of blood when wiping recently.  He denies any other medical problems and is not on any medications chronically.  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  He denies any sick contacts around him.  He denies any foreign travel or drinking unfiltered water out of streams.  He denies any history of C. Diff or recent antibiotic use.  HPI     History reviewed. No pertinent past medical history.  There are no problems to display for this patient.   Past Surgical History:  Procedure Laterality Date  . EYE SURGERY         History reviewed. No pertinent family history.  Social History   Tobacco Use  . Smoking status: Never Smoker  . Smokeless tobacco: Never Used  Substance Use Topics  . Alcohol use: No  . Drug use: No    Home Medications Prior to Admission medications   Medication Sig Start Date End Date Taking? Authorizing Provider  buPROPion (WELLBUTRIN SR) 100 MG 12 hr tablet Take 1 tablet (100 mg total) by mouth daily. 01/01/12 12/31/12  Raeford Razor, MD  ciprofloxacin (CIPRO) 500 MG tablet Take 1 tablet (500 mg total) by mouth every 12 (twelve) hours for 7 days. 06/07/20 06/14/20  Terald Sleeper, MD  metroNIDAZOLE (FLAGYL) 500 MG tablet Take 1 tablet (500 mg total) by mouth 2 (two) times daily for 7 days. 06/07/20 06/14/20  Terald Sleeper, MD    Allergies     Patient has no known allergies.  Review of Systems   Review of Systems  Constitutional: Negative for chills and fever.  Respiratory: Negative for cough and shortness of breath.   Cardiovascular: Negative for chest pain and palpitations.  Gastrointestinal: Positive for diarrhea. Negative for abdominal pain, constipation, nausea and vomiting.  Musculoskeletal: Negative for arthralgias and myalgias.  Skin: Negative for color change and rash.  Neurological: Negative for syncope and headaches.  Psychiatric/Behavioral: Negative for agitation and confusion.  All other systems reviewed and are negative.   Physical Exam Updated Vital Signs BP (!) 147/80 (BP Location: Right Arm)   Pulse (!) 106   Temp 99.4 F (37.4 C) (Oral)   Resp 20   Ht 6\' 3"  (1.905 m)   Wt 136.1 kg   SpO2 100%   BMI 37.50 kg/m   Physical Exam Vitals and nursing note reviewed.  Constitutional:      Appearance: He is well-developed and well-nourished. He is obese.  HENT:     Head: Normocephalic and atraumatic.  Eyes:     Conjunctiva/sclera: Conjunctivae normal.  Cardiovascular:     Rate and Rhythm: Regular rhythm. Tachycardia present.     Comments: HR 105 Pulmonary:     Effort: Pulmonary effort is normal. No respiratory distress.  Abdominal:     General: There is no distension.  Palpations: Abdomen is soft.     Tenderness: There is no abdominal tenderness. There is no guarding.  Musculoskeletal:        General: No edema.     Cervical back: Neck supple.  Skin:    General: Skin is warm and dry.  Neurological:     General: No focal deficit present.     Mental Status: He is alert and oriented to person, place, and time.  Psychiatric:        Mood and Affect: Mood and affect normal.        Behavior: Behavior normal.     ED Results / Procedures / Treatments   Labs (all labs ordered are listed, but only abnormal results are displayed) Labs Reviewed  COMPREHENSIVE METABOLIC PANEL - Abnormal; Notable  for the following components:      Result Value   Glucose, Bld 107 (*)    ALT 47 (*)    All other components within normal limits  CBC - Abnormal; Notable for the following components:   MCH 25.4 (*)    All other components within normal limits  URINALYSIS, ROUTINE W REFLEX MICROSCOPIC - Abnormal; Notable for the following components:   Specific Gravity, Urine >1.030 (*)    All other components within normal limits  LIPASE, BLOOD    EKG None  Radiology No results found.  Procedures Procedures (including critical care time)  Medications Ordered in ED Medications - No data to display  ED Course  I have reviewed the triage vital signs and the nursing notes.  Pertinent labs & imaging results that were available during my care of the patient were reviewed by me and considered in my medical decision making (see chart for details).  34 year old male present emerge department with diarrhea for 4 days.  Differential includes foodborne illness versus viral enteritis versus colitis versus other.  He has a benign abdominal exam.  No leukocytosis or fever.  In the very low suspicion for C. difficile or appendicitis.  He has no nausea or vomiting to suggest a bowel obstruction.  I personally reviewed his labs.  CBC and BMP largely unremarkable.  UA with high specific gravity - perhaps some mild dehydration here to explain this tachycardia.   Advised p.o. fluids.  I gave him a watch and wait prescription for colitis medications, if he continues having persistent or worsening diarrhea in 7 days to fill the prescription and begin taking it.  Answered all of his questions.  Okay for discharge.   Final Clinical Impression(s) / ED Diagnoses Final diagnoses:  Diarrhea, unspecified type    Rx / DC Orders ED Discharge Orders         Ordered    ciprofloxacin (CIPRO) 500 MG tablet  Every 12 hours        06/07/20 1737    metroNIDAZOLE (FLAGYL) 500 MG tablet  2 times daily        06/07/20  1737           [name], Kermit Balo, MD 06/07/20 1743

## 2020-06-07 NOTE — Discharge Instructions (Signed)
If you continue having the same level of diarrhea in 7 days, you can fill and begin taking the prescription I provided you with antibiotics.  Remember not to drink alcohol taking these drugs.  Try to take them with a little bit of food.  Keep drinking lots of water at home.

## 2020-06-07 NOTE — ED Notes (Signed)
AVS reviewed and provided to pt, discussed the 2 Rx by the EDP also, also discussed diet while having diarrhea. Opportunity for questions provided

## 2020-06-07 NOTE — ED Triage Notes (Signed)
Pt states he has had diarrhea for the past 4 days, denies abdominal pain/nausea/vomiting. States has been able to eat/drink.

## 2020-07-09 ENCOUNTER — Ambulatory Visit: Payer: BC Managed Care – PPO

## 2021-01-21 ENCOUNTER — Ambulatory Visit (INDEPENDENT_AMBULATORY_CARE_PROVIDER_SITE_OTHER): Payer: Self-pay | Admitting: Family Medicine

## 2021-01-29 DIAGNOSIS — Z0289 Encounter for other administrative examinations: Secondary | ICD-10-CM

## 2021-02-04 ENCOUNTER — Ambulatory Visit (INDEPENDENT_AMBULATORY_CARE_PROVIDER_SITE_OTHER): Payer: BC Managed Care – PPO | Admitting: Family Medicine

## 2021-05-21 ENCOUNTER — Telehealth (INDEPENDENT_AMBULATORY_CARE_PROVIDER_SITE_OTHER): Payer: Self-pay

## 2021-05-21 NOTE — Telephone Encounter (Signed)
Called pt LM, about picking up new pt packet. The pts dead line to have the packet picked up was 11/29. I stated in my message that if its not picked up by 10 am. We will cancel the appt and charge the pt fees.  

## 2021-06-05 ENCOUNTER — Ambulatory Visit (INDEPENDENT_AMBULATORY_CARE_PROVIDER_SITE_OTHER): Payer: BC Managed Care – PPO | Admitting: Family Medicine

## 2021-06-05 ENCOUNTER — Encounter (INDEPENDENT_AMBULATORY_CARE_PROVIDER_SITE_OTHER): Payer: Self-pay | Admitting: Family Medicine

## 2021-06-05 ENCOUNTER — Other Ambulatory Visit: Payer: Self-pay

## 2021-06-05 VITALS — BP 122/78 | HR 91 | Temp 98.1°F | Ht 76.0 in | Wt 315.0 lb

## 2021-06-05 DIAGNOSIS — M2550 Pain in unspecified joint: Secondary | ICD-10-CM | POA: Diagnosis not present

## 2021-06-05 DIAGNOSIS — R0683 Snoring: Secondary | ICD-10-CM

## 2021-06-05 DIAGNOSIS — R0602 Shortness of breath: Secondary | ICD-10-CM | POA: Diagnosis not present

## 2021-06-05 DIAGNOSIS — R5383 Other fatigue: Secondary | ICD-10-CM

## 2021-06-05 DIAGNOSIS — E65 Localized adiposity: Secondary | ICD-10-CM | POA: Diagnosis not present

## 2021-06-05 DIAGNOSIS — F3289 Other specified depressive episodes: Secondary | ICD-10-CM

## 2021-06-05 DIAGNOSIS — R7301 Impaired fasting glucose: Secondary | ICD-10-CM

## 2021-06-05 DIAGNOSIS — Z6838 Body mass index (BMI) 38.0-38.9, adult: Secondary | ICD-10-CM

## 2021-06-05 DIAGNOSIS — E559 Vitamin D deficiency, unspecified: Secondary | ICD-10-CM

## 2021-06-06 LAB — CBC WITH DIFFERENTIAL/PLATELET
Basophils Absolute: 0.1 10*3/uL (ref 0.0–0.2)
Basos: 1 %
EOS (ABSOLUTE): 0.2 10*3/uL (ref 0.0–0.4)
Eos: 2 %
Hematocrit: 43.5 % (ref 37.5–51.0)
Hemoglobin: 13.8 g/dL (ref 13.0–17.7)
Immature Grans (Abs): 0 10*3/uL (ref 0.0–0.1)
Immature Granulocytes: 0 %
Lymphocytes Absolute: 2 10*3/uL (ref 0.7–3.1)
Lymphs: 28 %
MCH: 24.8 pg — ABNORMAL LOW (ref 26.6–33.0)
MCHC: 31.7 g/dL (ref 31.5–35.7)
MCV: 78 fL — ABNORMAL LOW (ref 79–97)
Monocytes Absolute: 0.6 10*3/uL (ref 0.1–0.9)
Monocytes: 8 %
Neutrophils Absolute: 4.4 10*3/uL (ref 1.4–7.0)
Neutrophils: 61 %
Platelets: 364 10*3/uL (ref 150–450)
RBC: 5.57 x10E6/uL (ref 4.14–5.80)
RDW: 13.5 % (ref 11.6–15.4)
WBC: 7.3 10*3/uL (ref 3.4–10.8)

## 2021-06-06 LAB — T4, FREE: Free T4: 1.02 ng/dL (ref 0.82–1.77)

## 2021-06-06 LAB — COMPREHENSIVE METABOLIC PANEL
ALT: 33 IU/L (ref 0–44)
AST: 24 IU/L (ref 0–40)
Albumin/Globulin Ratio: 1.5 (ref 1.2–2.2)
Albumin: 4.2 g/dL (ref 4.0–5.0)
Alkaline Phosphatase: 104 IU/L (ref 44–121)
BUN/Creatinine Ratio: 16 (ref 9–20)
BUN: 15 mg/dL (ref 6–20)
Bilirubin Total: 0.3 mg/dL (ref 0.0–1.2)
CO2: 24 mmol/L (ref 20–29)
Calcium: 9.2 mg/dL (ref 8.7–10.2)
Chloride: 101 mmol/L (ref 96–106)
Creatinine, Ser: 0.95 mg/dL (ref 0.76–1.27)
Globulin, Total: 2.8 g/dL (ref 1.5–4.5)
Glucose: 113 mg/dL — ABNORMAL HIGH (ref 70–99)
Potassium: 4.7 mmol/L (ref 3.5–5.2)
Sodium: 140 mmol/L (ref 134–144)
Total Protein: 7 g/dL (ref 6.0–8.5)
eGFR: 108 mL/min/{1.73_m2} (ref >59)

## 2021-06-06 LAB — LIPID PANEL
Chol/HDL Ratio: 4.7 ratio (ref 0.0–5.0)
Cholesterol, Total: 154 mg/dL (ref 100–199)
HDL: 33 mg/dL — ABNORMAL LOW (ref >39)
LDL Chol Calc (NIH): 97 mg/dL (ref 0–99)
Triglycerides: 131 mg/dL (ref 0–149)
VLDL Cholesterol Cal: 24 mg/dL (ref 5–40)

## 2021-06-06 LAB — HEMOGLOBIN A1C
Est. average glucose Bld gHb Est-mCnc: 143 mg/dL
Hgb A1c MFr Bld: 6.6 % — ABNORMAL HIGH (ref 4.8–5.6)

## 2021-06-06 LAB — TSH: TSH: 1.47 u[IU]/mL (ref 0.450–4.500)

## 2021-06-06 LAB — VITAMIN B12: Vitamin B-12: 349 pg/mL (ref 232–1245)

## 2021-06-06 LAB — VITAMIN D 25 HYDROXY (VIT D DEFICIENCY, FRACTURES): Vit D, 25-Hydroxy: 11.2 ng/mL — ABNORMAL LOW (ref 30.0–100.0)

## 2021-06-06 LAB — INSULIN, RANDOM: INSULIN: 25.3 u[IU]/mL — ABNORMAL HIGH (ref 2.6–24.9)

## 2021-06-19 NOTE — Progress Notes (Signed)
Chief Complaint:   OBESITY SEVEN DOLLENS (MR# [id number]) is a 35 y.o. male who presents for evaluation and treatment of obesity and related comorbidities. Current BMI is Body mass index is 38.34 kg/m. Jamie Murphy has been struggling with his weight for many years and has been unsuccessful in either losing weight, maintaining weight loss, or reaching his healthy weight goal.  Jamie Murphy is currently in the action stage of change and ready to dedicate time achieving and maintaining a healthier weight. Jamie Murphy is interested in becoming our patient and working on intensive lifestyle modifications including (but not limited to) diet and exercise for weight loss.  Jamie Murphy's mother is in the program [employer]).  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  He likes to play Tekken on the PS4.  He is a Runner, broadcasting/film/video, working 40 hours per week.  He is single and is dating.  He lives with friends.  He says he walks 4 times per week for around 30 minutes each time.  He craves food at night and endorses late night eating.  He skips breakfast.  Jamie Murphy's habits were reviewed today and are as follows: his desired weight loss is 55 pounds, he has been heavy most of his life, he started gaining weight at age 24, his heaviest weight ever was 330 pounds, he craves meat, he snacks frequently in the evenings, he skips breakfast frequently, he is frequently drinking liquids with calories, he frequently makes poor food choices, he frequently eats larger portions than normal, and he struggles with emotional eating.  Depression Screen Jamie Murphy's Food and Mood (modified PHQ-9) score was 14.  Depression screen PHQ 2/9 06/05/2021  Decreased Interest 1  Down, Depressed, Hopeless 1  PHQ - 2 Score 2  Altered sleeping 0  Tired, decreased energy 2  Change in appetite 2  Feeling bad or failure about yourself  3  Trouble concentrating 2  Moving slowly or fidgety/restless 2  Suicidal thoughts 1  PHQ-9 Score 14  Difficult doing work/chores  Somewhat difficult   Assessment/Plan:   1. Other fatigue Jamie Murphy denies daytime somnolence and denies waking up still tired. Patent has a history of symptoms of snoring. Jamie Murphy generally gets 7 hours of sleep per night, and states that he has generally restful sleep. Snoring is present. Apneic episodes are present. Epworth Sleepiness Score is 0.  Jamie Murphy does feel that his weight is causing his energy to be lower than it should be. Fatigue may be related to obesity, depression or many other causes. Labs will be ordered, and in the meanwhile, Quavon will focus on self care including making healthy food choices, increasing physical activity and focusing on stress reduction.  - EKG 12-Lead - CBC with Differential/Platelet - Comprehensive metabolic panel - TSH - T4, free - Vitamin B12  2. SOB (shortness of breath) Jamie Murphy notes increasing shortness of breath with exercising and seems to be worsening over time with weight gain. He notes getting out of breath sooner with activity than he used to. This has not gotten worse recently. Jamie Murphy denies shortness of breath at rest or orthopnea.  Jamie Murphy does not feel that he gets out of breath more easily that he used to when he exercises. Jamie Murphy's shortness of breath appears to be obesity related and exercise induced. He has agreed to work on weight loss and gradually increase exercise to treat his exercise induced shortness of breath. Will continue to monitor closely.  - Lipid panel  3. Visceral obesity Current visceral fat rating: 17. Visceral fat  rating goal is < 13. Visceral adipose tissue is a hormonally active component of total body fat. This body composition phenotype is associated with medical disorders such as metabolic syndrome, cardiovascular disease, and several malignancies including prostate, breast, and colorectal cancers. Starting goal: Lose 7-10% of starting weight.   - Lipid panel  4. Arthralgia, unspecified joint We will  continue to monitor as it relates to his weight loss journey.  5. Fasting hyperglycemia Will check A1c and insulin level today, as per below.  - Hemoglobin A1c - Insulin, random  6. Snores Jamie Murphy endorses snoring and apneic episodes.  His Epworth Sleepiness Score is 0.  7. Vitamin D deficiency Jamie Murphy is not taking a vitamin D supplement.  Plan: Will check vitamin D level today.  - VITAMIN D 25 Hydroxy (Vit-D Deficiency, Fractures)  8. Other depression, with emotional eating Not at goal. Medication: Wellbutrin 150 mg daily.  PHQ-9 is 14 today.  He endorses craving food at night and late night eating.   Plan: Behavior modification techniques were discussed today to help deal with emotional/non-hunger eating behaviors.  9. Class 2 severe obesity with serious comorbidity and body mass index (BMI) of 38.0 to 38.9 in adult, unspecified obesity type (HCC)  Jamie Murphy is currently in the action stage of change and his goal is to continue with weight loss efforts. I recommend Chip begin the structured treatment plan as follows:  He has agreed to the Category 4 Plan.  Exercise goals:  As is.    Behavioral modification strategies: increasing lean protein intake, decreasing simple carbohydrates, increasing vegetables, increasing water intake, and decreasing liquid calories.  He was informed of the importance of frequent follow-up visits to maximize his success with intensive lifestyle modifications for his multiple health conditions. He was informed we would discuss his lab results at his next visit unless there is a critical issue that needs to be addressed sooner. Jamie Murphy agreed to keep his next visit at the agreed upon time to discuss these results.  Objective:   Blood pressure 122/78, pulse 91, temperature 98.1 F (36.7 C), height 6\' 4"  (1.93 m), weight (!) 315 lb (142.9 kg), SpO2 99 %. Body mass index is 38.34 kg/m.  EKG: Normal sinus rhythm, rate 89 bpm.  Indirect Calorimeter  completed today shows a VO2 of 380 and a REE of 2621.  His calculated basal metabolic rate is thus his basal metabolic rate is worse than expected.  General: Cooperative, alert, well developed, in no acute distress. HEENT: Conjunctivae and lids unremarkable. Cardiovascular: Regular rhythm.  Lungs: Normal work of breathing. Neurologic: No focal deficits.   Lab Results  Component Value Date   CREATININE 0.95 06/05/2021   BUN 15 06/05/2021   NA 140 06/05/2021   K 4.7 06/05/2021   CL 101 06/05/2021   CO2 24 06/05/2021   Lab Results  Component Value Date   ALT 33 06/05/2021   AST 24 06/05/2021   ALKPHOS 104 06/05/2021   BILITOT 0.3 06/05/2021   Lab Results  Component Value Date   HGBA1C 6.6 (H) 06/05/2021   Lab Results  Component Value Date   INSULIN 25.3 (H) 06/05/2021   Lab Results  Component Value Date   TSH 1.470 06/05/2021   Lab Results  Component Value Date   CHOL 154 06/05/2021   HDL 33 (L) 06/05/2021   LDLCALC 97 06/05/2021   TRIG 131 06/05/2021   CHOLHDL 4.7 06/05/2021   Lab Results  Component Value Date   VD25OH 11.2 (L) 06/05/2021  Lab Results  Component Value Date   WBC 7.3 06/05/2021   HGB 13.8 06/05/2021   HCT 43.5 06/05/2021   MCV 78 (L) 06/05/2021   PLT 364 06/05/2021   Attestation Statements:   This is the patient's first visit at [facility]. The patient's NEW PATIENT PACKET was reviewed at length. Included in the packet: current and past health history, medications, allergies, ROS, gynecologic history (women only), surgical history, family history, social history, weight history, weight loss surgery history (for those that have had weight loss surgery), nutritional evaluation, mood and food questionnaire, PHQ9, Epworth questionnaire, sleep habits questionnaire, patient life and health improvement goals questionnaire. These will all be scanned into the patient's chart under media.   During the visit, I independently  reviewed the patient's EKG, bioimpedance scale results, and indirect calorimeter results. I used this information to tailor a meal plan for the patient that will help him to lose weight and will improve his obesity-related conditions going forward. I performed a medically necessary appropriate examination and/or evaluation. I discussed the assessment and treatment plan with the patient. The patient was provided an opportunity to ask questions and all were answered. The patient agreed with the plan and demonstrated an understanding of the instructions. Labs were ordered at this visit and will be reviewed at the next visit unless more critical results need to be addressed immediately. Clinical information was updated and documented in the EMR.   I, Insurance claims handler, CMA, am acting as transcriptionist for Helane Rima, DO  I have reviewed the above documentation for accuracy and completeness, and I agree with the above. - Helane Rima, DO, MS, FAAFP, DABOM - Family and Bariatric Medicine.

## 2021-06-26 ENCOUNTER — Other Ambulatory Visit: Payer: Self-pay

## 2021-06-26 ENCOUNTER — Ambulatory Visit (INDEPENDENT_AMBULATORY_CARE_PROVIDER_SITE_OTHER): Payer: BC Managed Care – PPO | Admitting: Family Medicine

## 2021-06-26 ENCOUNTER — Encounter (INDEPENDENT_AMBULATORY_CARE_PROVIDER_SITE_OTHER): Payer: Self-pay | Admitting: Family Medicine

## 2021-06-26 VITALS — BP 113/72 | HR 82 | Temp 98.0°F | Ht 76.0 in | Wt 310.0 lb

## 2021-06-26 DIAGNOSIS — R7303 Prediabetes: Secondary | ICD-10-CM | POA: Diagnosis not present

## 2021-06-26 DIAGNOSIS — E65 Localized adiposity: Secondary | ICD-10-CM

## 2021-06-26 DIAGNOSIS — E559 Vitamin D deficiency, unspecified: Secondary | ICD-10-CM

## 2021-06-26 DIAGNOSIS — Z6837 Body mass index (BMI) 37.0-37.9, adult: Secondary | ICD-10-CM

## 2021-06-26 DIAGNOSIS — Z6838 Body mass index (BMI) 38.0-38.9, adult: Secondary | ICD-10-CM

## 2021-06-26 DIAGNOSIS — E786 Lipoprotein deficiency: Secondary | ICD-10-CM | POA: Diagnosis not present

## 2021-06-26 MED ORDER — VITAMIN D (ERGOCALCIFEROL) 1.25 MG (50000 UNIT) PO CAPS: 50000.0000 [IU] | ORAL_CAPSULE | ORAL | 0 refills | Status: DC

## 2021-06-30 NOTE — Progress Notes (Signed)
Chief Complaint:   OBESITY Jamie Murphy is here to discuss his progress with his obesity treatment plan along with follow-up of his obesity related diagnoses. See Medical Weight Management Flowsheet for complete bioelectrical impedance results.  Today's visit was #: 2 Starting weight: 315 lbs Starting date: 06/05/2021 Weight change since last visit: 5 lbs Total lbs lost to date: 5 lbs Total weight loss percentage to date: -1.59%  Nutrition Plan: Category 4 Plan for 75% of the time.  Activity: Cardio for 60-90 minutes 3 times per week.  Interim History: Jamie Murphy says he did well with the meal plan.  He has increased his exercise.  Assessment/Plan:   1. Prediabetes Not at goal. Goal is HgbA1c < 5.7.  Medication: None.  A1c is 6.6, but he has already lost 5 pounds and likely has decreased A1c to below 6.5.  Okay to continue with prediabetes as diagnosis since he is more comfortable with this right now.  Plan: He will continue to focus on protein-rich, low simple carbohydrate foods. We reviewed the importance of hydration, regular exercise for stress reduction, and restorative sleep.   Lab Results  Component Value Date   HGBA1C 6.6 (H) 06/05/2021   Lab Results  Component Value Date   INSULIN 25.3 (H) 06/05/2021   2. Vitamin D deficiency Not at goal.   Plan: Start to take prescription Vitamin D @50 ,000 IU every week as prescribed.  Follow-up for routine testing of Vitamin D, at least 2-3 times per year to avoid over-replacement.  Lab Results  Component Value Date   VD25OH 11.2 (L) 06/05/2021   - Refill Vitamin D, Ergocalciferol, (DRISDOL) 1.25 MG (50000 UNIT) CAPS capsule; Take 1 capsule (50,000 Units total) by mouth every 7 (seven) days.  Dispense: 12 capsule; Refill: 0  3. Low HDL (under 40) Jamie Murphy's HDL was 33 at last check on 06/05/2021.  Lab Results  Component Value Date   CHOL 154 06/05/2021   HDL 33 (L) 06/05/2021   LDLCALC 97 06/05/2021   TRIG 131 06/05/2021    CHOLHDL 4.7 06/05/2021   Lab Results  Component Value Date   ALT 33 06/05/2021   AST 24 06/05/2021   ALKPHOS 104 06/05/2021   BILITOT 0.3 06/05/2021   4. Visceral obesity Current visceral fat rating: 16. Visceral fat rating goal is < 13. Visceral adipose tissue is a hormonally active component of total body fat. This body composition phenotype is associated with medical disorders such as metabolic syndrome, cardiovascular disease, and several malignancies including prostate, breast, and colorectal cancers. Starting goal: Lose 7-10% of starting weight.   5. Obesity, current BMI 37.7  Course: Jamie Murphy is currently in the action stage of change. As such, his goal is to continue with weight loss efforts.   Nutrition goals: He has agreed to the Category 4 Plan.   Exercise goals:  As is.  Behavioral modification strategies: increasing lean protein intake, decreasing simple carbohydrates, increasing vegetables, and increasing water intake.  Jamie Murphy has agreed to follow-up with our clinic in 4 weeks. He was informed of the importance of frequent follow-up visits to maximize his success with intensive lifestyle modifications for his multiple health conditions.   Objective:   Blood pressure 113/72, pulse 82, temperature 98 F (36.7 C), temperature source Oral, height 6\' 4"  (1.93 m), weight (!) 310 lb (140.6 kg), SpO2 98 %. Body mass index is 37.73 kg/m.  General: Cooperative, alert, well developed, in no acute distress. HEENT: Conjunctivae and lids unremarkable. Cardiovascular: Regular rhythm.  Lungs:  Normal work of breathing. Neurologic: No focal deficits.   Lab Results  Component Value Date   CREATININE 0.95 06/05/2021   BUN 15 06/05/2021   NA 140 06/05/2021   K 4.7 06/05/2021   CL 101 06/05/2021   CO2 24 06/05/2021   Lab Results  Component Value Date   ALT 33 06/05/2021   AST 24 06/05/2021   ALKPHOS 104 06/05/2021   BILITOT 0.3 06/05/2021   Lab Results  Component  Value Date   HGBA1C 6.6 (H) 06/05/2021   Lab Results  Component Value Date   INSULIN 25.3 (H) 06/05/2021   Lab Results  Component Value Date   TSH 1.470 06/05/2021   Lab Results  Component Value Date   CHOL 154 06/05/2021   HDL 33 (L) 06/05/2021   LDLCALC 97 06/05/2021   TRIG 131 06/05/2021   CHOLHDL 4.7 06/05/2021   Lab Results  Component Value Date   VD25OH 11.2 (L) 06/05/2021   Lab Results  Component Value Date   WBC 7.3 06/05/2021   HGB 13.8 06/05/2021   HCT 43.5 06/05/2021   MCV 78 (L) 06/05/2021   PLT 364 06/05/2021   Attestation Statements:   Reviewed by clinician on day of visit: allergies, medications, problem list, medical history, surgical history, family history, social history, and previous encounter notes.  I, Insurance claims handler, CMA, am acting as transcriptionist for Helane Rima, DO  I have reviewed the above documentation for accuracy and completeness, and I agree with the above. -  Helane Rima, DO, MS, FAAFP, DABOM - Family and Bariatric Medicine.

## 2021-07-17 ENCOUNTER — Other Ambulatory Visit: Payer: Self-pay

## 2021-07-17 ENCOUNTER — Encounter (INDEPENDENT_AMBULATORY_CARE_PROVIDER_SITE_OTHER): Payer: Self-pay | Admitting: Family Medicine

## 2021-07-17 ENCOUNTER — Ambulatory Visit (INDEPENDENT_AMBULATORY_CARE_PROVIDER_SITE_OTHER): Payer: BC Managed Care – PPO | Admitting: Family Medicine

## 2021-07-17 VITALS — BP 138/77 | HR 71 | Temp 98.2°F | Ht 76.0 in | Wt 306.0 lb

## 2021-07-17 DIAGNOSIS — Z6839 Body mass index (BMI) 39.0-39.9, adult: Secondary | ICD-10-CM

## 2021-07-17 DIAGNOSIS — E559 Vitamin D deficiency, unspecified: Secondary | ICD-10-CM

## 2021-07-17 DIAGNOSIS — R7303 Prediabetes: Secondary | ICD-10-CM

## 2021-07-17 DIAGNOSIS — E669 Obesity, unspecified: Secondary | ICD-10-CM

## 2021-07-17 DIAGNOSIS — E65 Localized adiposity: Secondary | ICD-10-CM

## 2021-07-17 DIAGNOSIS — Z6838 Body mass index (BMI) 38.0-38.9, adult: Secondary | ICD-10-CM

## 2021-07-23 NOTE — Progress Notes (Signed)
Chief Complaint:   OBESITY Jamie Murphy is here to discuss his progress with his obesity treatment plan along with follow-up of his obesity related diagnoses. See Medical Weight Management Flowsheet for complete bioelectrical impedance results.  Today's visit was #: 3 Starting weight: 315 lbs Starting date: 06/05/2021 Weight change since last visit: 4 lbs Total lbs lost to date: 9 lbs Total weight loss percentage to date: -2.86%  Nutrition Plan: Category 4 Plan for 60% of the time.  Activity: Walking/cardio for 30-45 minutes 4 times per week.   Interim History: Jamie Murphy says he has had no sweet tea.  Assessment/Plan:   1. Prediabetes Not at goal. Goal is HgbA1c < 5.7.  Medication: None.    Plan: He will continue to focus on protein-rich, low simple carbohydrate foods. We reviewed the importance of hydration, regular exercise for stress reduction, and restorative sleep.   Lab Results  Component Value Date   HGBA1C 6.6 (H) 06/05/2021   Lab Results  Component Value Date   INSULIN 25.3 (H) 06/05/2021   2. Visceral obesity Current visceral fat rating: 18. Visceral fat rating goal is < 13. Visceral adipose tissue is a hormonally active component of total body fat. This body composition phenotype is associated with medical disorders such as metabolic syndrome, cardiovascular disease, and several malignancies including prostate, breast, and colorectal cancers. Starting goal: Lose 7-10% of starting weight.   3. Vitamin D deficiency Not at goal. He is taking vitamin D 50,000 IU weekly.  Plan: Continue to take prescription Vitamin D @50 ,000 IU every week as prescribed.  Follow-up for routine testing of Vitamin D, at least 2-3 times per year to avoid over-replacement.  Lab Results  Component Value Date   VD25OH 11.2 (L) 06/05/2021   4. Obesity, current BMI 39.4  Course: Jamie Murphy is currently in the action stage of change. As such, his goal is to continue with weight loss efforts.    Nutrition goals: He has agreed to the Category 4 Plan.   Exercise goals:  As is.  Behavioral modification strategies: increasing lean protein intake, decreasing simple carbohydrates, increasing vegetables, and increasing water intake.  Jamie Murphy has agreed to follow-up with our clinic in 4 weeks. He was informed of the importance of frequent follow-up visits to maximize his success with intensive lifestyle modifications for his multiple health conditions.   Objective:   Blood pressure 138/77, pulse 71, temperature 98.2 F (36.8 C), temperature source Oral, height 6\' 4"  (1.93 m), weight (!) 306 lb (138.8 kg), SpO2 100 %. Body mass index is 37.25 kg/m.  General: Cooperative, alert, well developed, in no acute distress. HEENT: Conjunctivae and lids unremarkable. Cardiovascular: Regular rhythm.  Lungs: Normal work of breathing. Neurologic: No focal deficits.   Lab Results  Component Value Date   CREATININE 0.95 06/05/2021   BUN 15 06/05/2021   NA 140 06/05/2021   K 4.7 06/05/2021   CL 101 06/05/2021   CO2 24 06/05/2021   Lab Results  Component Value Date   ALT 33 06/05/2021   AST 24 06/05/2021   ALKPHOS 104 06/05/2021   BILITOT 0.3 06/05/2021   Lab Results  Component Value Date   HGBA1C 6.6 (H) 06/05/2021   Lab Results  Component Value Date   INSULIN 25.3 (H) 06/05/2021   Lab Results  Component Value Date   TSH 1.470 06/05/2021   Lab Results  Component Value Date   CHOL 154 06/05/2021   HDL 33 (L) 06/05/2021   LDLCALC 97 06/05/2021  TRIG 131 06/05/2021   CHOLHDL 4.7 06/05/2021   Lab Results  Component Value Date   VD25OH 11.2 (L) 06/05/2021   Lab Results  Component Value Date   WBC 7.3 06/05/2021   HGB 13.8 06/05/2021   HCT 43.5 06/05/2021   MCV 78 (L) 06/05/2021   PLT 364 06/05/2021   Attestation Statements:   Reviewed by clinician on day of visit: allergies, medications, problem list, medical history, surgical history, family history, social  history, and previous encounter notes.  I, Insurance claims handler, CMA, am acting as transcriptionist for Helane Rima, DO  I have reviewed the above documentation for accuracy and completeness, and I agree with the above. -  Helane Rima, DO, MS, FAAFP, DABOM - Family and Bariatric Medicine.

## 2021-08-14 ENCOUNTER — Ambulatory Visit (INDEPENDENT_AMBULATORY_CARE_PROVIDER_SITE_OTHER): Payer: BC Managed Care – PPO | Admitting: Family Medicine

## 2021-08-14 ENCOUNTER — Encounter (INDEPENDENT_AMBULATORY_CARE_PROVIDER_SITE_OTHER): Payer: Self-pay | Admitting: Family Medicine

## 2021-08-14 ENCOUNTER — Other Ambulatory Visit: Payer: Self-pay

## 2021-08-14 VITALS — BP 112/75 | HR 84 | Temp 98.0°F | Ht 76.0 in | Wt 301.0 lb

## 2021-08-14 DIAGNOSIS — E669 Obesity, unspecified: Secondary | ICD-10-CM | POA: Diagnosis not present

## 2021-08-14 DIAGNOSIS — E559 Vitamin D deficiency, unspecified: Secondary | ICD-10-CM

## 2021-08-14 DIAGNOSIS — Z6838 Body mass index (BMI) 38.0-38.9, adult: Secondary | ICD-10-CM

## 2021-08-14 DIAGNOSIS — R7303 Prediabetes: Secondary | ICD-10-CM

## 2021-08-14 DIAGNOSIS — E786 Lipoprotein deficiency: Secondary | ICD-10-CM

## 2021-08-14 DIAGNOSIS — Z6836 Body mass index (BMI) 36.0-36.9, adult: Secondary | ICD-10-CM

## 2021-08-18 NOTE — Progress Notes (Signed)
Chief Complaint:   OBESITY Jamie Murphy is here to discuss his progress with his obesity treatment plan along with follow-up of his obesity related diagnoses. See Medical Weight Management Flowsheet for complete bioelectrical impedance results.  Today's visit was #: 4 Starting weight: 315 lbs Starting date: 06/05/2021 Weight change since last visit: 5 lbs Total lbs lost to date: 14 lbs Total weight loss percentage to date: -4.44%  Nutrition Plan: Category 4 Plan for 80% of the time.  Activity: Cardio for 30-45 minutes 3-4 times per week.  Interim History: Jamie Murphy says he is planning to go to [place] in September.  He just got a new Apple watch.  He says he is interested in better workouts.  He is still being careful with carbs.   Assessment/Plan:   1. Prediabetes Not at goal. Goal is HgbA1c < 5.7.  Medication: None.    Plan: He will continue to focus on protein-rich, low simple carbohydrate foods. We reviewed the importance of hydration, regular exercise for stress reduction, and restorative sleep.   Lab Results  Component Value Date   HGBA1C 6.6 (H) 06/05/2021   Lab Results  Component Value Date   INSULIN 25.3 (H) 06/05/2021   2. Vitamin D deficiency Not at goal. He is taking vitamin D 50,000 IU weekly.  Plan: Continue to take prescription Vitamin D @50 ,000 IU every week as prescribed.  Follow-up for routine testing of Vitamin D, at least 2-3 times per year to avoid over-replacement.  Lab Results  Component Value Date   VD25OH 11.2 (L) 06/05/2021   3. Low HDL (under 40) Course: Not at goal. Lipid-lowering medications: None.   Plan: Dietary changes: Increase soluble fiber, decrease simple carbohydrates, decrease saturated fat. Exercise changes: Moderate to vigorous-intensity aerobic activity 150 minutes per week or as tolerated. We will continue to monitor along with PCP/specialists as it pertains to his weight loss journey.  Lab Results  Component Value Date   CHOL  154 06/05/2021   HDL 33 (L) 06/05/2021   LDLCALC 97 06/05/2021   TRIG 131 06/05/2021   CHOLHDL 4.7 06/05/2021   Lab Results  Component Value Date   ALT 33 06/05/2021   AST 24 06/05/2021   ALKPHOS 104 06/05/2021   BILITOT 0.3 06/05/2021   4. Obesity, current BMI 36.7  Course: Jamie Murphy is currently in the action stage of change. As such, his goal is to continue with weight loss efforts.   Nutrition goals: He has agreed to the Category 4 Plan.   Exercise goals:  As is.  Behavioral modification strategies: increasing lean protein intake, decreasing simple carbohydrates, increasing vegetables, and increasing water intake.  Jamie Murphy has agreed to follow-up with our clinic in 4 weeks. He was informed of the importance of frequent follow-up visits to maximize his success with intensive lifestyle modifications for his multiple health conditions.   Objective:   Blood pressure 112/75, pulse 84, temperature 98 F (36.7 C), temperature source Oral, height 6\' 4"  (1.93 m), weight (!) 301 lb (136.5 kg), SpO2 97 %. Body mass index is 36.64 kg/m.  General: Cooperative, alert, well developed, in no acute distress. HEENT: Conjunctivae and lids unremarkable. Cardiovascular: Regular rhythm.  Lungs: Normal work of breathing. Neurologic: No focal deficits.   Lab Results  Component Value Date   CREATININE 0.95 06/05/2021   BUN 15 06/05/2021   NA 140 06/05/2021   K 4.7 06/05/2021   CL 101 06/05/2021   CO2 24 06/05/2021   Lab Results  Component Value Date  ALT 33 06/05/2021   AST 24 06/05/2021   ALKPHOS 104 06/05/2021   BILITOT 0.3 06/05/2021   Lab Results  Component Value Date   HGBA1C 6.6 (H) 06/05/2021   Lab Results  Component Value Date   INSULIN 25.3 (H) 06/05/2021   Lab Results  Component Value Date   TSH 1.470 06/05/2021   Lab Results  Component Value Date   CHOL 154 06/05/2021   HDL 33 (L) 06/05/2021   LDLCALC 97 06/05/2021   TRIG 131 06/05/2021   CHOLHDL 4.7  06/05/2021   Lab Results  Component Value Date   VD25OH 11.2 (L) 06/05/2021   Lab Results  Component Value Date   WBC 7.3 06/05/2021   HGB 13.8 06/05/2021   HCT 43.5 06/05/2021   MCV 78 (L) 06/05/2021   PLT 364 06/05/2021   Attestation Statements:   Reviewed by clinician on day of visit: allergies, medications, problem list, medical history, surgical history, family history, social history, and previous encounter notes.  I, Insurance claims handler, CMA, am acting as transcriptionist for Helane Rima, DO  I have reviewed the above documentation for accuracy and completeness, and I agree with the above. -  Helane Rima, DO, MS, FAAFP, DABOM - Family and Bariatric Medicine.

## 2021-09-15 ENCOUNTER — Other Ambulatory Visit: Payer: Self-pay

## 2021-09-15 ENCOUNTER — Encounter (INDEPENDENT_AMBULATORY_CARE_PROVIDER_SITE_OTHER): Payer: Self-pay | Admitting: Family Medicine

## 2021-09-15 ENCOUNTER — Ambulatory Visit (INDEPENDENT_AMBULATORY_CARE_PROVIDER_SITE_OTHER): Payer: BC Managed Care – PPO | Admitting: Family Medicine

## 2021-09-15 VITALS — BP 125/77 | HR 83 | Temp 98.0°F | Ht 76.0 in | Wt 294.0 lb

## 2021-09-15 DIAGNOSIS — R7303 Prediabetes: Secondary | ICD-10-CM | POA: Diagnosis not present

## 2021-09-15 DIAGNOSIS — E669 Obesity, unspecified: Secondary | ICD-10-CM

## 2021-09-15 DIAGNOSIS — F3289 Other specified depressive episodes: Secondary | ICD-10-CM

## 2021-09-15 DIAGNOSIS — Z6835 Body mass index (BMI) 35.0-35.9, adult: Secondary | ICD-10-CM

## 2021-09-15 DIAGNOSIS — E559 Vitamin D deficiency, unspecified: Secondary | ICD-10-CM | POA: Diagnosis not present

## 2021-09-15 MED ORDER — BUPROPION HCL ER (SR) 150 MG PO TB12: 150.0000 mg | ORAL_TABLET | Freq: Every day | ORAL | 0 refills | Status: AC

## 2021-09-15 MED ORDER — VITAMIN D (ERGOCALCIFEROL) 1.25 MG (50000 UNIT) PO CAPS
50000.0000 [IU] | ORAL_CAPSULE | ORAL | 0 refills | Status: AC
Start: 2021-09-15 — End: ?

## 2021-09-23 NOTE — Progress Notes (Signed)
Chief Complaint:   OBESITY Jamie Murphy is here to discuss his progress with his obesity treatment plan along with follow-up of his obesity related diagnoses. See Medical Weight Management Flowsheet for complete bioelectrical impedance results.  Today's visit was #: 5 Starting weight: 315 lbs Starting date: 06/05/2021 Weight change since last visit: 7 lbs Total lbs lost to date: 21 lbs Total weight loss percentage to date: -6.67%  Nutrition Plan: Category 4 Plan for 60-70% of the time.  Activity: Walking for 30-40 minutes 4-5 times per week.  Interim History: Jamie Murphy says he is doing well.  He has increased his steps.  Assessment/Plan:   1. Vitamin D deficiency Not at goal. She is taking vitamin D 50,000 IU weekly.  Plan: Continue to take prescription Vitamin D @50 ,000 IU every week as prescribed.  Follow-up for routine testing of Vitamin D, at least 2-3 times per year to avoid over-replacement.  Lab Results  Component Value Date   VD25OH 11.2 (L) 06/05/2021   - Refill Vitamin D, Ergocalciferol, (DRISDOL) 1.25 MG (50000 UNIT) CAPS capsule; Take 1 capsule (50,000 Units total) by mouth every 7 (seven) days.  Dispense: 12 capsule; Refill: 0  2. Prediabetes Not at goal. Goal is HgbA1c < 5.7.  Medication: None.    Plan: He will continue to focus on protein-rich, low simple carbohydrate foods. We reviewed the importance of hydration, regular exercise for stress reduction, and restorative sleep.   Lab Results  Component Value Date   HGBA1C 6.6 (H) 06/05/2021   Lab Results  Component Value Date   INSULIN 25.3 (H) 06/05/2021   3. Other depression, with emotional eating Controlled. Medication: Wellbutrin 150 mg subcutaneously daily.   Plan: Discussed cues and consequences, how thoughts affect eating, model of thoughts, feelings, and behaviors, and strategies for change by focusing on the cue. Discussed cognitive distortions, coping thoughts, and how to change your  thoughts.  - Refill buPROPion (WELLBUTRIN SR) 150 MG 12 hr tablet; Take 1 tablet (150 mg total) by mouth daily.  Dispense: 90 tablet; Refill: 0  4. Obesity, current BMI 35.8  Course: Jamie Murphy is currently in the action stage of change. As such, his goal is to continue with weight loss efforts.   Nutrition goals: He has agreed to the Category 4 Plan.   Exercise goals:  As is.  Behavioral modification strategies: increasing lean protein intake, decreasing simple carbohydrates, increasing vegetables, and increasing water intake.  [place] has agreed to follow-up with our clinic in 6 weeks. He was informed of the importance of frequent follow-up visits to maximize his success with intensive lifestyle modifications for his multiple health conditions.   Objective:   Blood pressure 125/77, pulse 83, temperature 98 F (36.7 C), temperature source Oral, height 6\' 4"  (1.93 m), weight 294 lb (133.4 kg), SpO2 99 %. Body mass index is 35.79 kg/m.  General: Cooperative, alert, well developed, in no acute distress. HEENT: Conjunctivae and lids unremarkable. Cardiovascular: Regular rhythm.  Lungs: Normal work of breathing. Neurologic: No focal deficits.   Lab Results  Component Value Date   CREATININE 0.95 06/05/2021   BUN 15 06/05/2021   NA 140 06/05/2021   K 4.7 06/05/2021   CL 101 06/05/2021   CO2 24 06/05/2021   Lab Results  Component Value Date   ALT 33 06/05/2021   AST 24 06/05/2021   ALKPHOS 104 06/05/2021   BILITOT 0.3 06/05/2021   Lab Results  Component Value Date   HGBA1C 6.6 (H) 06/05/2021   Lab  Results  Component Value Date   INSULIN 25.3 (H) 06/05/2021   Lab Results  Component Value Date   TSH 1.470 06/05/2021   Lab Results  Component Value Date   CHOL 154 06/05/2021   HDL 33 (L) 06/05/2021   LDLCALC 97 06/05/2021   TRIG 131 06/05/2021   CHOLHDL 4.7 06/05/2021   Lab Results  Component Value Date   VD25OH 11.2 (L) 06/05/2021   Lab Results  Component  Value Date   WBC 7.3 06/05/2021   HGB 13.8 06/05/2021   HCT 43.5 06/05/2021   MCV 78 (L) 06/05/2021   PLT 364 06/05/2021   Attestation Statements:   Reviewed by clinician on day of visit: allergies, medications, problem list, medical history, surgical history, family history, social history, and previous encounter notes.  I, Insurance claims handler, CMA, am acting as transcriptionist for Helane Rima, DO  I have reviewed the above documentation for accuracy and completeness, and I agree with the above. -  Helane Rima, DO, MS, FAAFP, DABOM - Family and Bariatric Medicine.
